# Patient Record
Sex: Female | Born: 1995 | Race: Black or African American | Hispanic: No | Marital: Single | Smoking: Never smoker
Health system: Southern US, Community
[De-identification: ages and names within clinical notes are randomized; demographics above are authoritative.]

## PROBLEM LIST (undated history)

## (undated) DIAGNOSIS — J349 Unspecified disorder of nose and nasal sinuses: Secondary | ICD-10-CM

## (undated) DIAGNOSIS — J40 Bronchitis, not specified as acute or chronic: Secondary | ICD-10-CM

## (undated) DIAGNOSIS — N200 Calculus of kidney: Secondary | ICD-10-CM

## (undated) DIAGNOSIS — J45909 Unspecified asthma, uncomplicated: Secondary | ICD-10-CM

---
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
## 2017-08-08 ENCOUNTER — Encounter (HOSPITAL_COMMUNITY): Payer: Self-pay | Admitting: Family Medicine

## 2017-08-08 ENCOUNTER — Emergency Department (HOSPITAL_COMMUNITY)
Admission: EM | Admit: 2017-08-08 | Discharge: 2017-08-08 | Disposition: A | Discharge status: Home / Self Care | Payer: Self-pay | Attending: Emergency Medicine | Admitting: Emergency Medicine

## 2017-08-08 DIAGNOSIS — J36 Peritonsillar abscess: Secondary | ICD-10-CM | POA: Insufficient documentation

## 2017-08-08 DIAGNOSIS — J45909 Unspecified asthma, uncomplicated: Secondary | ICD-10-CM | POA: Insufficient documentation

## 2017-08-08 LAB — RAPID STREP SCREEN (MED CTR MEBANE ONLY): STREPTOCOCCUS, GROUP A SCREEN (DIRECT): POSITIVE — AB

## 2017-08-08 MED ORDER — PENICILLIN G BENZATHINE & PROC 1200000 UNIT/2ML IM SUSP
1.2000 10*6.[IU] | Freq: Once | INTRAMUSCULAR | Status: DC
  Filled 2017-08-08: qty 2

## 2017-08-08 MED ORDER — OXYCODONE-ACETAMINOPHEN 5-325 MG PO TABS: 1.0000 | ORAL_TABLET | Freq: Three times a day (TID) | ORAL | 0 refills | Status: DC | PRN

## 2017-08-08 MED ORDER — DEXAMETHASONE SODIUM PHOSPHATE 10 MG/ML IJ SOLN
10.0000 mg | Freq: Once | INTRAMUSCULAR | Status: AC
  Administered 2017-08-08: 10 mg via INTRAVENOUS
  Filled 2017-08-08: qty 1

## 2017-08-08 MED ORDER — CLINDAMYCIN PHOSPHATE 600 MG/50ML IV SOLN
600.0000 mg | Freq: Once | INTRAVENOUS | Status: AC
  Administered 2017-08-08: 600 mg via INTRAVENOUS
  Filled 2017-08-08: qty 50

## 2017-08-08 MED ORDER — SODIUM CHLORIDE 0.9 % IV BOLUS (SEPSIS)
1000.0000 mL | Freq: Once | INTRAVENOUS | Status: AC
  Administered 2017-08-08: 1000 mL via INTRAVENOUS

## 2017-08-08 MED ORDER — CLINDAMYCIN HCL 300 MG PO CAPS: 300.0000 mg | ORAL_CAPSULE | Freq: Three times a day (TID) | ORAL | 0 refills | Status: AC

## 2017-08-08 NOTE — ED Notes (Signed)
Patient c/o sorethroat for several days, states this am she noticed a black area to the left side of her throat. Scabbed looking area on her left tonsil

## 2017-08-08 NOTE — ED Provider Notes (Signed)
[facility] Provider Note   CSN: [id number] Arrival date & time: 08/08/17  0807     History   Chief Complaint Chief Complaint  Patient presents with  . Sore Throat    HPI Melanie Yang is a 21 y.o. female with a history of strep pharyngitis who presents to the emergency department with a chief complaint of sore throat that began yesterday and significantly worsened overnight. She denies fever, chills, drooling, trismus, or muffled voice. She states she treated her symptoms by gargling salt water at home without relief.  The history is provided by the patient. No language interpreter was used.    Past Medical History:  Diagnosis Date  . Asthma   . Bronchitis   . Sinus trouble     There are no active problems to display for this patient.   History reviewed. No pertinent surgical history.  OB History    No data available       Home Medications    Prior to Admission medications   Medication Sig Start Date End Date Taking? Authorizing Provider  clindamycin (CLEOCIN) 300 MG capsule Take 1 capsule (300 mg total) by mouth 3 (three) times daily. 08/08/17 08/18/17  Xiamara Hulet A, PA-C  metroNIDAZOLE (FLAGYL) 500 MG tablet Take 1 tablet (500 mg total) by mouth 2 (two) times daily. One po bid x 7 days 10/23/12   Riki Sheer, PA-C  oxyCODONE-acetaminophen (PERCOCET/ROXICET) 5-325 MG tablet Take 1 tablet by mouth every 8 (eight) hours as needed for severe pain. 08/08/17   Aneeka Bowden, Coral Else, PA-C    Family History History reviewed. No pertinent family history.  Social History Social History  Substance Use Topics  . Smoking status: Never Smoker  . Smokeless tobacco: Not on file  . Alcohol use No     Allergies   Patient has no known allergies.   Review of Systems Review of Systems  Constitutional: Negative for activity change, chills and fever.  HENT: Positive for sore throat.   Respiratory: Negative for shortness of breath.   Cardiovascular: Negative  for chest pain.  Gastrointestinal: Negative for abdominal pain.  Musculoskeletal: Negative for back pain.  Skin: Negative for rash.   Physical Exam Updated Vital Signs BP 111/78 (BP Location: Left Arm)   Pulse 94   Temp 98.8 F (37.1 C) (Oral)   Resp 12   LMP 07/14/2017   SpO2 100%   Physical Exam  Constitutional: No distress.  HENT:  Head: Normocephalic.  Right Ear: Tympanic membrane normal.  Left Ear: Tympanic membrane normal.  Nose: Nose normal. Right sinus exhibits no maxillary sinus tenderness and no frontal sinus tenderness. Left sinus exhibits no maxillary sinus tenderness and no frontal sinus tenderness.  Mouth/Throat: No trismus in the jaw. Posterior oropharyngeal erythema present. Tonsils are 3+ on the left. Tonsillar exudate.  Uvula deviated.   Eyes: Conjunctivae are normal.  Neck: Neck supple.  Cardiovascular: Normal rate and regular rhythm.  Exam reveals no gallop and no friction rub.   No murmur heard. Pulmonary/Chest: Effort normal. No respiratory distress.  Abdominal: Soft. She exhibits no distension.  Neurological: She is alert.  Skin: Skin is warm. No rash noted.  Psychiatric: Her behavior is normal.  Nursing note and vitals reviewed.        ED Treatments / Results  Labs (all labs ordered are listed, but only abnormal results are displayed) Labs Reviewed  RAPID STREP SCREEN (NOT AT [facility]) - Abnormal; Notable for the following:  Result Value   Streptococcus, Group A Screen (Direct) POSITIVE (*)    All other components within normal limits    EKG  EKG Interpretation None       Radiology No results found.  Procedures Procedures (including critical care time)  Medications Ordered in ED Medications  sodium chloride 0.9 % bolus 1,000 mL (0 mLs Intravenous Stopped 08/08/17 1249)  dexamethasone (DECADRON) injection 10 mg (10 mg Intravenous Given 08/08/17 1127)  clindamycin (CLEOCIN) IVPB 600 mg (0 mg Intravenous Stopped 08/08/17 1248)      Initial Impression / Assessment and Plan / ED Course  I have reviewed the triage vital signs and the nursing notes.  Pertinent labs & imaging results that were available during my care of the patient were reviewed by me and considered in my medical decision making (see chart for details).     21 year old female presenting with a peritonsillar abscess. Rapid strep positive. Consulted ear nose and throat and spoke with Dr. Annalee Genta who recommended Decadron, IV fluids, and 1 dose of IV clindamycin in the emergency department followed by 10 days of clindamycin TID and pain medication. He instructed to have the patient follow up in his office if symptoms do not improve in 3-4 days. Strict return precautions given. No acute distress. The patient is safe for discharge at this time.  Final Clinical Impressions(s) / ED Diagnoses   Final diagnoses:  Peritonsillar abscess    New Prescriptions Discharge Medication List as of 08/08/2017 11:26 AM    START taking these medications   Details  clindamycin (CLEOCIN) 300 MG capsule Take 1 capsule (300 mg total) by mouth 3 (three) times daily., Starting Fri 08/08/2017, Until Mon 08/18/2017, Print         Marcy Sookdeo A, PA-C 08/08/17 Ed Blalock, MD 08/09/17 1409

## 2017-08-08 NOTE — Discharge Instructions (Signed)
You have been seen and treated today for a period tonsillar abscess. You were given 1 dose of clindamycin, an antibiotic, IV fluids, and a dose of Decadron, a steroid medication to help with swelling, in the emergency department.  Please take clindamycin every 8 hours for the next 10 days to treat your infection. This is an antibiotic so it is important that you do not miss any doses and he continue taking this medication even if you starts to feel better. Peritonsillar abscesses can be painful. You may take 1 tablet of Percocet every 8 hours as needed for pain control. Please do not drive or take this medication before you go to work because it can make you drowsy. It is also a narcotic medication and can be addicting so please only take for severe pain.  The area that appears purple and black on your tonsil can take up to a week to improve. This can be normal since your infection is caused by a bacteria called strep.  You can also take 800 mg of ibuprofen with food every 8 hours or 650 mg of Tylenol every 6 hours for pain and inflammation control.  If you have any problems filling your prescription for your antibiotic, please give [place] a call at the hospital.   If your symptoms do not improve by Monday or Tuesday, please call Dr. Thurmon Fair office to schedule a follow up appointment.   If you develop new or worsening symptoms including, difficulty breathing, shortness of breath, or feeling as if your throat is closing, please return to the emergency department for reevaluation.

## 2017-08-08 NOTE — ED Triage Notes (Signed)
Pt here for sore throat x 2 days with swelling and erythema.

## 2017-08-08 NOTE — Discharge Planning (Signed)
Pt  stating Rx prescribed may be too expensive and she can not afford them.  NCM searched [employer]Rx[employer]com for an affordable coupon.  NCM text coupon to pt phone and stayed online until it was received.  Pt very appreciative.

## 2021-10-01 ENCOUNTER — Other Ambulatory Visit: Payer: Self-pay

## 2021-10-01 ENCOUNTER — Encounter (HOSPITAL_COMMUNITY): Payer: Self-pay | Admitting: Emergency Medicine

## 2021-10-01 ENCOUNTER — Ambulatory Visit (HOSPITAL_COMMUNITY)
Admission: EM | Admit: 2021-10-01 | Discharge: 2021-10-01 | Disposition: A | Discharge status: Home / Self Care | Payer: Self-pay | Attending: Emergency Medicine | Admitting: Emergency Medicine

## 2021-10-01 DIAGNOSIS — B349 Viral infection, unspecified: Secondary | ICD-10-CM

## 2021-10-01 MED ORDER — LIDOCAINE VISCOUS HCL 2 % MT SOLN: 15.0000 mL | OROMUCOSAL | 0 refills | Status: DC | PRN

## 2021-10-01 NOTE — ED Triage Notes (Signed)
Pt c/o sore throat, body aches, cough, runny nose and fever x 2 days.

## 2021-10-01 NOTE — ED Provider Notes (Signed)
[facility]    CSN: [id number] Arrival date & time: 10/01/21  0805      History   Chief Complaint Chief Complaint  Patient presents with   Cough   Sore Throat   Generalized Body Aches    HPI Melanie Yang is a 25 y.o. female.   Patient presents with fever, chills, body aches, sore throat, nasal congestion, rhinorrhea, bilateral ear fullness and nonproductive cough for 2 days.  Tolerating food and liquids.  Not attempted treatment of symptoms.  Child at home has similar symptoms.  History of asthma. denies tenderness headaches, abdominal pain, nausea, vomiting, diarrhea, chest pain or tightness, shortness of breath, wheezing.  Past Medical History:  Diagnosis Date   Asthma    Bronchitis    Sinus trouble     There are no problems to display for this patient.   History reviewed. No pertinent surgical history.  OB History   No obstetric history on file.      Home Medications    Prior to Admission medications   Medication Sig Start Date End Date Taking? Authorizing Provider  metroNIDAZOLE (FLAGYL) 500 MG tablet Take 1 tablet (500 mg total) by mouth 2 (two) times daily. One po bid x 7 days 10/23/12   Riki Sheer, PA-C  oxyCODONE-acetaminophen (PERCOCET/ROXICET) 5-325 MG tablet Take 1 tablet by mouth every 8 (eight) hours as needed for severe pain. 08/08/17   McDonald, Coral Else, PA-C    Family History History reviewed. No pertinent family history.  Social History Social History   Tobacco Use   Smoking status: Never  Substance Use Topics   Alcohol use: No   Drug use: No     Allergies   Patient has no known allergies.   Review of Systems Review of Systems  Constitutional:  Positive for fever. Negative for activity change, appetite change, chills, diaphoresis, fatigue and unexpected weight change.  HENT:  Positive for congestion, ear pain, rhinorrhea and sore throat. Negative for dental problem, drooling, ear discharge, facial swelling, hearing  loss, mouth sores, nosebleeds, postnasal drip, sinus pressure, sinus pain, sneezing, tinnitus, trouble swallowing and voice change.   Respiratory:  Positive for cough and shortness of breath. Negative for apnea, choking, chest tightness, wheezing and stridor.   Cardiovascular: Negative.   Gastrointestinal:  Positive for nausea. Negative for abdominal distention, abdominal pain, anal bleeding, blood in stool, constipation, diarrhea, rectal pain and vomiting.  Skin: Negative.   Neurological: Negative.     Physical Exam Triage Vital Signs ED Triage Vitals  Enc Vitals Group     BP 10/01/21 0831 114/70     Pulse Rate 10/01/21 0831 90     Resp 10/01/21 0831 20     Temp 10/01/21 0831 98.7 F (37.1 C)     Temp Source 10/01/21 0831 Oral     SpO2 10/01/21 0831 100 %     Weight 10/01/21 0832 160 lb (72.6 kg)     Height 10/01/21 0832 4\' 11"  (1.499 m)     Head Circumference --      Peak Flow --      Pain Score 10/01/21 0832 6     Pain Loc --      Pain Edu? --      Excl. in GC? --    No data found.  Updated Vital Signs BP 114/70   Pulse 90   Temp 98.7 F (37.1 C) (Oral)   Resp 20   Ht 4\' 11"  (1.499 m)   Wt  160 lb (72.6 kg)   SpO2 99%   BMI 32.32 kg/m   Visual Acuity Right Eye Distance:   Left Eye Distance:   Bilateral Distance:    Right Eye Near:   Left Eye Near:    Bilateral Near:     Physical Exam Constitutional:      Appearance: Normal appearance. She is normal weight.  HENT:     Head: Normocephalic.     Right Ear: Tympanic membrane, ear canal and external ear normal.     Left Ear: Tympanic membrane and external ear normal.     Nose: Congestion and rhinorrhea present.     Mouth/Throat:     Mouth: Mucous membranes are moist.     Pharynx: Posterior oropharyngeal erythema present.     Tonsils: No tonsillar exudate or tonsillar abscesses. 1+ on the right. 1+ on the left.  Eyes:     Extraocular Movements: Extraocular movements intact.  Cardiovascular:     Rate and  Rhythm: Normal rate and regular rhythm.     Pulses: Normal pulses.     Heart sounds: Normal heart sounds.  Pulmonary:     Effort: Pulmonary effort is normal.     Breath sounds: Normal breath sounds.  Musculoskeletal:     Cervical back: Normal range of motion.  Lymphadenopathy:     Cervical: Cervical adenopathy present.  Skin:    General: Skin is warm and dry.  Neurological:     Mental Status: She is alert and oriented to person, place, and time. Mental status is at baseline.  Psychiatric:        Mood and Affect: Mood normal.        Behavior: Behavior normal.     UC Treatments / Results  Labs (all labs ordered are listed, but only abnormal results are displayed) Labs Reviewed - No data to display  EKG   Radiology No results found.  Procedures Procedures (including critical care time)  Medications Ordered in UC Medications - No data to display  Initial Impression / Assessment and Plan / UC Course  I have reviewed the triage vital signs and the nursing notes.  Pertinent labs & imaging results that were available during my care of the patient were reviewed by me and considered in my medical decision making (see chart for details).  Viral illness  1.  Lidocaine viscous 2% 15 mils every 4 hours as needed 2.  Over-the-counter medications for remaining symptom management 3.  Urgent care follow-up as needed Final Clinical Impressions(s) / UC Diagnoses   Final diagnoses:  None   Discharge Instructions   None    ED Prescriptions   None    PDMP not reviewed this encounter.   Valinda Hoar, [place] 10/01/21 [phone]

## 2021-10-01 NOTE — Discharge Instructions (Signed)
Your symptoms are most likely viral will resolve on their own, it may take up to 7 to 10 days before you truly start to feel better  You may gargle and spit Lidocaine solution every 4 hours as needed to provide temporary relief for your Throat  You can take Tylenol and/or Ibuprofen as needed for fever reduction and pain relief.   For cough: honey 1/2 to 1 teaspoon (you can dilute the honey in water or another fluid).  You can also use guaifenesin and dextromethorphan for cough. You can use a humidifier for chest congestion and cough.  If you don't have a humidifier, you can sit in the bathroom with the hot shower running.      For sore throat: try warm salt water gargles, cepacol lozenges, throat spray, warm tea or water with lemon/honey, popsicles or ice, or OTC cold relief medicine for throat discomfort.   For congestion: take a daily anti-histamine like Zyrtec, Claritin, and a oral decongestant, such as pseudoephedrine.  You can also use Flonase 1-2 sprays in each nostril daily.   It is important to stay hydrated: drink plenty of fluids (water, gatorade/powerade/pedialyte, juices, or teas) to keep your throat moisturized and help further relieve irritation/discomfort.

## 2021-10-30 ENCOUNTER — Ambulatory Visit (HOSPITAL_COMMUNITY)
Admission: EM | Admit: 2021-10-30 | Discharge: 2021-10-30 | Disposition: A | Discharge status: Home / Self Care | Payer: Self-pay | Attending: Urgent Care | Admitting: Urgent Care

## 2021-10-30 ENCOUNTER — Other Ambulatory Visit: Payer: Self-pay

## 2021-10-30 ENCOUNTER — Encounter (HOSPITAL_COMMUNITY): Payer: Self-pay | Admitting: Emergency Medicine

## 2021-10-30 DIAGNOSIS — R109 Unspecified abdominal pain: Secondary | ICD-10-CM | POA: Insufficient documentation

## 2021-10-30 DIAGNOSIS — R110 Nausea: Secondary | ICD-10-CM | POA: Insufficient documentation

## 2021-10-30 LAB — POCT URINALYSIS DIPSTICK, ED / UC
Bilirubin Urine: NEGATIVE
Glucose, UA: NEGATIVE mg/dL
Ketones, ur: NEGATIVE mg/dL
Leukocytes,Ua: NEGATIVE
Nitrite: NEGATIVE
Protein, ur: NEGATIVE mg/dL
Specific Gravity, Urine: 1.02 (ref 1.005–1.030)
Urobilinogen, UA: 1 mg/dL (ref 0.0–1.0)
pH: 7.5 (ref 5.0–8.0)

## 2021-10-30 LAB — POC URINE PREG, ED: Preg Test, Ur: NEGATIVE

## 2021-10-30 MED ORDER — TIZANIDINE HCL 4 MG PO TABS: 4.0000 mg | ORAL_TABLET | Freq: Every day | ORAL | 0 refills | Status: DC

## 2021-10-30 MED ORDER — NAPROXEN 500 MG PO TABS: 500.0000 mg | ORAL_TABLET | Freq: Two times a day (BID) | ORAL | 0 refills | Status: DC

## 2021-10-30 NOTE — Discharge Instructions (Signed)
We will notify you of any positive results from your testing today. Our nurse will let you know if you need any kind of treatment based off of those results. In the meantime, use naproxen and tizanidine for musculoskeletal pain. If you worsen then please report to the ER as you will need imaging and blood work to make sure everything is ok internally.

## 2021-10-30 NOTE — ED Provider Notes (Signed)
[place]   MRN: [id number] DOB: 1996/03/02  Subjective:   Khrystine Druschel is a 25 y.o. female presenting for 2-day history of acute onset persistent left-sided abdominal pain, flank pain, intermittent nausea without vomiting, urinary frequency.  No dysuria, hematuria, vaginal discharge, pelvic pain, chest pain, shortness of breath.  Patient is agreeable to a pregnancy test, LMP was a week ago.  No concern for STI but is not opposed to this testing either.  No constipation, history of GI issues.  No history of kidney stones.  Patient does not do any strenuous lifting, no falls, trauma.  No current facility-administered medications for this encounter.  Current Outpatient Medications:    lidocaine (XYLOCAINE) 2 % solution, Use as directed 15 mLs in the mouth or throat as needed for mouth pain., Disp: 100 mL, Rfl: 0   metroNIDAZOLE (FLAGYL) 500 MG tablet, Take 1 tablet (500 mg total) by mouth 2 (two) times daily. One po bid x 7 days, Disp: 14 tablet, Rfl: 0   oxyCODONE-acetaminophen (PERCOCET/ROXICET) 5-325 MG tablet, Take 1 tablet by mouth every 8 (eight) hours as needed for severe pain., Disp: 8 tablet, Rfl: 0   No Known Allergies  Past Medical History:  Diagnosis Date   Asthma    Bronchitis    Sinus trouble      History reviewed. No pertinent surgical history.  No family history on file.  Social History   Tobacco Use   Smoking status: Never  Substance Use Topics   Alcohol use: No   Drug use: No    ROS   Objective:   Vitals: BP 129/87 (BP Location: Left Arm)   Pulse 96   Temp 98.4 F (36.9 C) (Oral)   Resp 18   LMP 10/20/2021   SpO2 98%   Physical Exam Constitutional:      General: She is not in acute distress.    Appearance: Normal appearance. She is well-developed. She is not ill-appearing, toxic-appearing or diaphoretic.  HENT:     Head: Normocephalic and atraumatic.     Nose: Nose normal.     Mouth/Throat:     Mouth: Mucous  membranes are moist.     Pharynx: Oropharynx is clear.  Eyes:     General: No scleral icterus.       Right eye: No discharge.        Left eye: No discharge.     Extraocular Movements: Extraocular movements intact.     Conjunctiva/sclera: Conjunctivae normal.     Pupils: Pupils are equal, round, and reactive to light.  Cardiovascular:     Rate and Rhythm: Normal rate and regular rhythm.     Pulses: Normal pulses.     Heart sounds: Normal heart sounds. No murmur heard.   No friction rub. No gallop.  Pulmonary:     Effort: Pulmonary effort is normal. No respiratory distress.     Breath sounds: Normal breath sounds. No stridor. No wheezing, rhonchi or rales.  Abdominal:     General: Bowel sounds are normal. There is no distension.     Palpations: Abdomen is soft. There is no mass.     Tenderness: There is abdominal tenderness (mild over left flank side). There is no right CVA tenderness, left CVA tenderness, guarding or rebound.  Skin:    General: Skin is warm and dry.     Findings: No rash.  Neurological:     General: No focal deficit present.     Mental Status: She  is alert and oriented to person, place, and time.  Psychiatric:        Mood and Affect: Mood normal.        Behavior: Behavior normal.        Thought Content: Thought content normal.        Judgment: Judgment normal.    Results for orders placed or performed during the hospital encounter of 10/30/21 (from the past 24 hour(s))  POC Urinalysis dipstick     Status: Abnormal   Collection Time: 10/30/21 10:41 AM  Result Value Ref Range   Glucose, UA NEGATIVE NEGATIVE mg/dL   Bilirubin Urine NEGATIVE NEGATIVE   Ketones, ur NEGATIVE NEGATIVE mg/dL   Specific Gravity, Urine 1.020 1.005 - 1.030   Hgb urine dipstick TRACE (A) NEGATIVE   pH 7.5 5.0 - 8.0   Protein, ur NEGATIVE NEGATIVE mg/dL   Urobilinogen, UA 1.0 0.0 - 1.0 mg/dL   Nitrite NEGATIVE NEGATIVE   Leukocytes,Ua NEGATIVE NEGATIVE  POC urine pregnancy      Status: None   Collection Time: 10/30/21 10:45 AM  Result Value Ref Range   Preg Test, Ur NEGATIVE NEGATIVE     Assessment and Plan :   PDMP not reviewed this encounter.  1. Left sided abdominal pain   2. Nausea    Will manage conservatively with naproxen, tizanidine for musculoskeletal pain.  Patient does not endorse any constipation, urinalysis is equivocal.  STI testing pending.  Low suspicion for an acute abdomen, diverticulitis or any other acute intra-abdominal process.  Do not suspect renal colic, pyelonephritis. Counseled patient on potential for adverse effects with medications prescribed/recommended today, ER and return-to-clinic precautions discussed, patient verbalized understanding.    Wallis Bamberg, PA-C 10/30/21 1520

## 2021-10-30 NOTE — ED Triage Notes (Signed)
Pt reports having abd pain more on left side that radiates to her back for a couple days with nausea.

## 2021-10-31 ENCOUNTER — Emergency Department (HOSPITAL_COMMUNITY): Payer: Self-pay

## 2021-10-31 ENCOUNTER — Emergency Department (HOSPITAL_COMMUNITY)
Admission: EM | Admit: 2021-10-31 | Discharge: 2021-10-31 | Disposition: A | Discharge status: Home / Self Care | Payer: Self-pay | Attending: Emergency Medicine | Admitting: Emergency Medicine

## 2021-10-31 ENCOUNTER — Encounter (HOSPITAL_COMMUNITY): Payer: Self-pay

## 2021-10-31 ENCOUNTER — Telehealth (HOSPITAL_COMMUNITY): Payer: Self-pay | Admitting: Nurse Practitioner

## 2021-10-31 DIAGNOSIS — J45909 Unspecified asthma, uncomplicated: Secondary | ICD-10-CM | POA: Insufficient documentation

## 2021-10-31 DIAGNOSIS — R109 Unspecified abdominal pain: Secondary | ICD-10-CM | POA: Insufficient documentation

## 2021-10-31 LAB — URINALYSIS, MICROSCOPIC (REFLEX)
Bacteria, UA: NONE SEEN
Squamous Epithelial / HPF: NONE SEEN (ref 0–5)

## 2021-10-31 LAB — CBC WITH DIFFERENTIAL/PLATELET
Abs Immature Granulocytes: 0.05 10*3/uL (ref 0.00–0.07)
Basophils Absolute: 0 10*3/uL (ref 0.0–0.1)
Basophils Relative: 0 %
Eosinophils Absolute: 0 10*3/uL (ref 0.0–0.5)
Eosinophils Relative: 0 %
HCT: 38.3 % (ref 36.0–46.0)
Hemoglobin: 12.1 g/dL (ref 12.0–15.0)
Immature Granulocytes: 1 %
Lymphocytes Relative: 28 %
Lymphs Abs: 2.6 10*3/uL (ref 0.7–4.0)
MCH: 28.4 pg (ref 26.0–34.0)
MCHC: 31.6 g/dL (ref 30.0–36.0)
MCV: 89.9 fL (ref 80.0–100.0)
Monocytes Absolute: 0.6 10*3/uL (ref 0.1–1.0)
Monocytes Relative: 6 %
Neutro Abs: 5.9 10*3/uL (ref 1.7–7.7)
Neutrophils Relative %: 65 %
Platelets: 255 10*3/uL (ref 150–400)
RBC: 4.26 MIL/uL (ref 3.87–5.11)
RDW: 12.9 % (ref 11.5–15.5)
WBC: 9.1 10*3/uL (ref 4.0–10.5)
nRBC: 0 % (ref 0.0–0.2)

## 2021-10-31 LAB — CERVICOVAGINAL ANCILLARY ONLY
Bacterial Vaginitis (gardnerella): POSITIVE — AB
Chlamydia: NEGATIVE
Comment: NEGATIVE
Comment: NEGATIVE
Comment: NEGATIVE
Comment: NORMAL
Neisseria Gonorrhea: NEGATIVE
Trichomonas: POSITIVE — AB

## 2021-10-31 LAB — HEPATIC FUNCTION PANEL
ALT: 12 U/L (ref 0–44)
AST: 18 U/L (ref 15–41)
Albumin: 3.7 g/dL (ref 3.5–5.0)
Alkaline Phosphatase: 69 U/L (ref 38–126)
Bilirubin, Direct: <0.1 mg/dL (ref 0.0–0.2)
Total Bilirubin: 0.6 mg/dL (ref 0.3–1.2)
Total Protein: 7.2 g/dL (ref 6.5–8.1)

## 2021-10-31 LAB — URINALYSIS, ROUTINE W REFLEX MICROSCOPIC
Bilirubin Urine: NEGATIVE
Glucose, UA: NEGATIVE mg/dL
Ketones, ur: NEGATIVE mg/dL
Nitrite: NEGATIVE
Protein, ur: NEGATIVE mg/dL
Specific Gravity, Urine: <1.005 — ABNORMAL LOW (ref 1.005–1.030)
pH: 6 (ref 5.0–8.0)

## 2021-10-31 LAB — MAGNESIUM: Magnesium: 1.9 mg/dL (ref 1.7–2.4)

## 2021-10-31 LAB — BASIC METABOLIC PANEL
Anion gap: 7 (ref 5–15)
BUN: <5 mg/dL — ABNORMAL LOW (ref 6–20)
CO2: 24 mmol/L (ref 22–32)
Calcium: 9.4 mg/dL (ref 8.9–10.3)
Chloride: 106 mmol/L (ref 98–111)
Creatinine, Ser: 0.78 mg/dL (ref 0.44–1.00)
GFR, Estimated: >60 mL/min (ref >60)
Glucose, Bld: 114 mg/dL — ABNORMAL HIGH (ref 70–99)
Potassium: 3.3 mmol/L — ABNORMAL LOW (ref 3.5–5.1)
Sodium: 137 mmol/L (ref 135–145)

## 2021-10-31 LAB — PREGNANCY, URINE: Preg Test, Ur: NEGATIVE

## 2021-10-31 LAB — POC URINE PREG, ED: Preg Test, Ur: NEGATIVE

## 2021-10-31 MED ORDER — LACTATED RINGERS IV BOLUS
1000.0000 mL | Freq: Once | INTRAVENOUS | Status: AC
  Administered 2021-10-31: 1000 mL via INTRAVENOUS

## 2021-10-31 MED ORDER — METRONIDAZOLE 500 MG PO TABS: 500.0000 mg | ORAL_TABLET | Freq: Two times a day (BID) | ORAL | 0 refills | Status: DC

## 2021-10-31 MED ORDER — ONDANSETRON HCL 4 MG/2ML IJ SOLN
4.0000 mg | Freq: Once | INTRAMUSCULAR | Status: AC
  Administered 2021-10-31: 4 mg via INTRAVENOUS
  Filled 2021-10-31: qty 2

## 2021-10-31 MED ORDER — KETOROLAC TROMETHAMINE 15 MG/ML IJ SOLN
15.0000 mg | Freq: Once | INTRAMUSCULAR | Status: AC
  Administered 2021-10-31: 15 mg via INTRAVENOUS
  Filled 2021-10-31: qty 1

## 2021-10-31 MED ORDER — METHOCARBAMOL 500 MG PO TABS
500.0000 mg | ORAL_TABLET | Freq: Once | ORAL | Status: AC
  Administered 2021-10-31: 500 mg via ORAL
  Filled 2021-10-31: qty 1

## 2021-10-31 MED ORDER — ACETAMINOPHEN 325 MG PO TABS
650.0000 mg | ORAL_TABLET | Freq: Once | ORAL | Status: AC
  Administered 2021-10-31: 650 mg via ORAL
  Filled 2021-10-31: qty 2

## 2021-10-31 MED ORDER — METHOCARBAMOL 500 MG PO TABS: 500.0000 mg | ORAL_TABLET | Freq: Two times a day (BID) | ORAL | 0 refills | Status: DC | PRN

## 2021-10-31 NOTE — ED Provider Notes (Signed)
[facility] EMERGENCY DEPARTMENT Provider Note   CSN: [id number] Arrival date & time: 10/31/21  0049     History Chief Complaint  Patient presents with   Flank Pain    Melanie Yang is a 25 y.o. female.   Flank Pain Associated symptoms include abdominal pain. Pertinent negatives include no chest pain, no headaches and no shortness of breath. Patient presents for left-sided flank pain over the past 3 days.  Initially pain was in her left side of abdomen.  It is since radiated to her left flank and left-sided back.  She has been trying to drink lots of water at home.  She went to urgent care yesterday.  At urgent care, they checked her urine and swab her for STIs.  While there, she was told that she had blood in her urine.  They also told her to follow-up about results of vaginal swabs.  She presents to the ED due to concern of kidney stones.  Patient has also noticed some bruising on her fingers and feels that her eyes have had some yellowing.  Currently, she endorses 6/10 severity pain in her left flank.  She does endorse mild nausea.  She denies any recent vomiting, diarrhea, fevers, or chills.  LMP was last week.     Past Medical History:  Diagnosis Date   Asthma    Bronchitis    Sinus trouble     There are no problems to display for this patient.   History reviewed. No pertinent surgical history.   OB History   No obstetric history on file.     History reviewed. No pertinent family history.  Social History   Tobacco Use   Smoking status: Never  Substance Use Topics   Alcohol use: No   Drug use: No    Home Medications Prior to Admission medications   Medication Sig Start Date End Date Taking? Authorizing Provider  methocarbamol (ROBAXIN) 500 MG tablet Take 1 tablet (500 mg total) by mouth every 12 (twelve) hours as needed for up to 12 doses for muscle spasms. 10/31/21  Yes Godfrey Pick, MD  lidocaine (XYLOCAINE) 2 % solution Use as directed  15 mLs in the mouth or throat as needed for mouth pain. 10/01/21   White, Leitha Schuller, NP  metroNIDAZOLE (FLAGYL) 500 MG tablet Take 1 tablet (500 mg total) by mouth 2 (two) times daily. Finish all the medication. Do not drink alcohol while on medication. 10/31/21   Enrique Sack, FNP  naproxen (NAPROSYN) 500 MG tablet Take 1 tablet (500 mg total) by mouth 2 (two) times daily with a meal. 10/30/21   Jaynee Eagles, PA-C  oxyCODONE-acetaminophen (PERCOCET/ROXICET) 5-325 MG tablet Take 1 tablet by mouth every 8 (eight) hours as needed for severe pain. 08/08/17   McDonald, Mia A, PA-C  tiZANidine (ZANAFLEX) 4 MG tablet Take 1 tablet (4 mg total) by mouth at bedtime. 10/30/21   Jaynee Eagles, PA-C    Allergies    Patient has no known allergies.  Review of Systems   Review of Systems  Constitutional:  Negative for activity change, appetite change, chills, fatigue and fever.  HENT:  Negative for congestion, ear pain and sore throat.   Eyes:  Negative for pain and visual disturbance.  Respiratory:  Negative for cough, chest tightness and shortness of breath.   Cardiovascular:  Negative for chest pain and palpitations.  Gastrointestinal:  Positive for abdominal pain and nausea. Negative for diarrhea and vomiting.  Genitourinary:  Positive for flank  pain. Negative for difficulty urinating, dysuria, hematuria and pelvic pain.  Musculoskeletal:  Negative for arthralgias, back pain, gait problem, joint swelling, myalgias and neck pain.  Skin:  Negative for color change and rash.  Neurological:  Negative for dizziness, seizures, syncope, weakness, light-headedness, numbness and headaches.  Hematological:  Does not bruise/bleed easily.  Psychiatric/Behavioral:  Negative for confusion and decreased concentration.   All other systems reviewed and are negative.  Physical Exam Updated Vital Signs BP 115/70 (BP Location: Left Arm)   Pulse 62   Temp 98.5 F (36.9 C) (Oral)   Resp 18   Ht 4\' 11"  (1.499 m)   Wt  73 kg   LMP 10/20/2021   SpO2 100%   BMI 32.51 kg/m   Physical Exam Vitals and nursing note reviewed.  Constitutional:      General: She is not in acute distress.    Appearance: Normal appearance. She is well-developed and normal weight. She is not ill-appearing, toxic-appearing or diaphoretic.  HENT:     Head: Normocephalic and atraumatic.     Right Ear: External ear normal.     Left Ear: External ear normal.     Nose: Nose normal. No congestion or rhinorrhea.     Mouth/Throat:     Mouth: Mucous membranes are moist.     Pharynx: Oropharynx is clear.  Eyes:     Extraocular Movements: Extraocular movements intact.     Conjunctiva/sclera: Conjunctivae normal.  Cardiovascular:     Rate and Rhythm: Normal rate and regular rhythm.     Heart sounds: No murmur heard. Pulmonary:     Effort: Pulmonary effort is normal. No respiratory distress.     Breath sounds: Normal breath sounds. No wheezing or rales.  Abdominal:     General: Abdomen is flat.     Palpations: Abdomen is soft.     Tenderness: There is no abdominal tenderness.  Musculoskeletal:        General: No swelling.     Cervical back: Normal range of motion and neck supple. No rigidity.     Right lower leg: No edema.     Left lower leg: No edema.  Skin:    General: Skin is warm and dry.     Capillary Refill: Capillary refill takes less than 2 seconds.     Coloration: Skin is not jaundiced or pale.  Neurological:     General: No focal deficit present.     Mental Status: She is alert and oriented to person, place, and time.     Cranial Nerves: No cranial nerve deficit.     Sensory: No sensory deficit.     Motor: No weakness.     Coordination: Coordination normal.  Psychiatric:        Attention and Perception: Attention and perception normal.        Mood and Affect: Mood is anxious.        Speech: Speech normal.        Behavior: Behavior normal. Behavior is cooperative.        Thought Content: Thought content normal.     ED Results / Procedures / Treatments   Labs (all labs ordered are listed, but only abnormal results are displayed) Labs Reviewed  BASIC METABOLIC PANEL - Abnormal; Notable for the following components:      Result Value   Potassium 3.3 (*)    Glucose, Bld 114 (*)    BUN <5 (*)    All other components within normal limits  URINALYSIS, ROUTINE W REFLEX MICROSCOPIC - Abnormal; Notable for the following components:   Color, Urine STRAW (*)    Specific Gravity, Urine <1.005 (*)    Hgb urine dipstick SMALL (*)    Leukocytes,Ua SMALL (*)    All other components within normal limits  CBC WITH DIFFERENTIAL/PLATELET  URINALYSIS, MICROSCOPIC (REFLEX)  HEPATIC FUNCTION PANEL  MAGNESIUM  PREGNANCY, URINE  I-STAT BETA HCG BLOOD, ED ([facility], [facility], [facility] ONLY)  POC URINE PREG, ED    EKG None  Radiology CT Renal Stone Study  Result Date: 10/31/2021 CLINICAL DATA:  Flank pain with kidney stone suspected EXAM: CT ABDOMEN AND PELVIS WITHOUT CONTRAST TECHNIQUE: Multidetector CT imaging of the abdomen and pelvis was performed following the standard protocol without IV contrast. COMPARISON:  None. FINDINGS: Lower chest:  No contributory findings. Hepatobiliary: No focal liver abnormality.No evidence of biliary obstruction or stone. Pancreas: Unremarkable. Spleen: Unremarkable. Adrenals/Urinary Tract: Negative adrenals. 2 right lower pole renal calculi, the larger measuring 6 mm. Unremarkable bladder. Stomach/Bowel:  No obstruction. No appendicitis. Vascular/Lymphatic: No acute vascular abnormality. No mass or adenopathy. Reproductive:No pathologic findings. Other: No ascites or pneumoperitoneum. Musculoskeletal: No acute abnormalities. IMPRESSION: No acute finding.  No hydronephrosis or ureteral calculus. Two right lower pole renal calculi, the larger measuring 6 mm. Electronically Signed   By: Jorje Guild M.D.   On: 10/31/2021 04:36    Procedures Procedures   Medications Ordered in ED Medications   acetaminophen (TYLENOL) tablet 650 mg (650 mg Oral Given 10/31/21 0138)  lactated ringers bolus 1,000 mL (0 mLs Intravenous Stopped 10/31/21 1000)  ketorolac (TORADOL) 15 MG/ML injection 15 mg (15 mg Intravenous Given 10/31/21 0839)  ondansetron (ZOFRAN) injection 4 mg (4 mg Intravenous Given 10/31/21 0838)  methocarbamol (ROBAXIN) tablet 500 mg (500 mg Oral Given 10/31/21 1008)    ED Course  I have reviewed the triage vital signs and the nursing notes.  Pertinent labs & imaging results that were available during my care of the patient were reviewed by me and considered in my medical decision making (see chart for details).    MDM Rules/Calculators/A&P                          Patient presents for 3 days of left flank pain and microscopic hematuria diagnosed yesterday at urgent care.  Prior to being bedded in the ED, she was able to undergo laboratory work-up and CT stone study.  Stone study showed no obstructive stones.  She does have 2 stones in lower pole of right kidney.  Urinalysis shows trace microscopic hematuria.  There is no evidence of UTI.  She has no leukocytosis.  On exam, she is well-appearing.  She continues to endorse left flank pain and nausea.  IV fluids, Toradol, and Zofran ordered for symptomatic relief.  Given her concerns of scleral icterus, LFTs were ordered.  On reassessment, patient reports continued pain in the area of her left back.  Dose of Robaxin was ordered.  Results of additional lab work are all reassuring.  Patient underwent cervical vaginal swabbing yesterday.  Results of that test are still in process.  Patient was advised to follow-up on those results and to return to the ED if she does experience any worsening of symptoms.  Patient was discharged in good condition.  Final Clinical Impression(s) / ED Diagnoses Final diagnoses:  Flank pain    Rx / DC Orders ED Discharge Orders  Ordered    methocarbamol (ROBAXIN) 500 MG tablet  Every 12 hours PRN         10/31/21 1103             Gloris Manchester, MD 10/31/21 1645

## 2021-10-31 NOTE — Telephone Encounter (Signed)
Patient reviewed lab results on MyChart which was positive for trichomonas and bacterial vaginosis. Treatment is indicated. Medication sent to patient's pharmacy. She should finish all the medications. Drink plenty of fluids. No alcohol while on medication. Avoid sexual contact until treatment completed, symptoms resolved and partner(s) finishes treatment. Nursing reviewed plan of treatment as discussed above with patient.

## 2021-10-31 NOTE — ED Provider Notes (Signed)
Emergency Medicine Provider Triage Evaluation Note  Melanie Yang , a 25 y.o. female  was evaluated in triage.  Pt complains of left flank pain.  Seen at UC earlier today and told it was MSK but pain has worsened.  Denies dysuria or hematuria but feels urine has been decreased.  Has STD testing earlier today.  No hx kidney stones.  Did have blood in UA at UC.  Review of Systems  Positive: Left flank pain Negative: Vomiting, diarrhea  Physical Exam  BP 122/77 (BP Location: Right Arm)   Pulse 80   Temp 98.4 F (36.9 C) (Oral)   Resp 20   Ht 4\' 11"  (1.499 m)   Wt 73 kg   LMP 10/20/2021   SpO2 99%   BMI 32.51 kg/m   Gen:   Awake, no distress   Resp:  Normal effort  MSK:   Moves extremities without difficulty  Other:    Medical Decision Making  Medically screening exam initiated at 1:24 AM.  Appropriate orders placed.  Melanie Yang was informed that the remainder of the evaluation will be completed by another provider, this initial triage assessment does not replace that evaluation, and the importance of remaining in the ED until their evaluation is complete.  Left flank pain.  Seen at UC earlier today, felt to be MSK but pain worsening.  Did have hematuria on UA.  Will check labs, UA, CT renal stone study.   10/22/2021, PA-C 10/31/21 0136    14/07/22, MD 10/31/21 [phone]

## 2021-10-31 NOTE — ED Triage Notes (Signed)
Pt arrives POV for eval of L sided flank pain w/ radiation to back. Pt reports onset Sunday. Pt reports N/V on Sunday, but not since then. Pt was seen at [facility] today for pain, sent home w/ NSAIDS and muscle relaxers. Pt reports she had small blood in her urine today. Concerned for kidney stone

## 2021-10-31 NOTE — ED Notes (Signed)
Called pt no answer °

## 2021-11-04 ENCOUNTER — Emergency Department (HOSPITAL_COMMUNITY): Payer: Self-pay

## 2021-11-04 ENCOUNTER — Emergency Department (HOSPITAL_COMMUNITY)
Admission: EM | Admit: 2021-11-04 | Discharge: 2021-11-04 | Disposition: A | Discharge status: Home / Self Care | Payer: Self-pay | Attending: Emergency Medicine | Admitting: Emergency Medicine

## 2021-11-04 ENCOUNTER — Encounter (HOSPITAL_COMMUNITY): Payer: Self-pay | Admitting: Emergency Medicine

## 2021-11-04 ENCOUNTER — Other Ambulatory Visit: Payer: Self-pay

## 2021-11-04 DIAGNOSIS — M549 Dorsalgia, unspecified: Secondary | ICD-10-CM

## 2021-11-04 DIAGNOSIS — Z20822 Contact with and (suspected) exposure to covid-19: Secondary | ICD-10-CM | POA: Insufficient documentation

## 2021-11-04 DIAGNOSIS — J45909 Unspecified asthma, uncomplicated: Secondary | ICD-10-CM | POA: Insufficient documentation

## 2021-11-04 DIAGNOSIS — R112 Nausea with vomiting, unspecified: Secondary | ICD-10-CM

## 2021-11-04 HISTORY — DX: Calculus of kidney: N20.0

## 2021-11-04 LAB — URINALYSIS, ROUTINE W REFLEX MICROSCOPIC
Bacteria, UA: NONE SEEN
Bilirubin Urine: NEGATIVE
Glucose, UA: NEGATIVE mg/dL
Ketones, ur: NEGATIVE mg/dL
Leukocytes,Ua: NEGATIVE
Nitrite: NEGATIVE
Protein, ur: NEGATIVE mg/dL
Specific Gravity, Urine: 1.001 — ABNORMAL LOW (ref 1.005–1.030)
pH: 7 (ref 5.0–8.0)

## 2021-11-04 LAB — RESP PANEL BY RT-PCR (FLU A&B, COVID) ARPGX2
Influenza A by PCR: NEGATIVE
Influenza B by PCR: NEGATIVE
SARS Coronavirus 2 by RT PCR: NEGATIVE

## 2021-11-04 LAB — PREGNANCY, URINE: Preg Test, Ur: NEGATIVE

## 2021-11-04 MED ORDER — ONDANSETRON 4 MG PO TBDP: 4.0000 mg | ORAL_TABLET | Freq: Three times a day (TID) | ORAL | 0 refills | Status: DC | PRN

## 2021-11-04 NOTE — ED Provider Notes (Signed)
Emergency Medicine Provider Triage Evaluation Note  Makaia Rappa , a 25 y.o. female  was evaluated in triage.  Pt complains of back pain.  She states that she is having swelling on the right sided upper back.  She first noticed this today.  She has had increasing pain in the area.  She was recently seen and diagnosed with a kidney stone, trichomoniasis, and BV, she states she has been taking the Flagyl.  She does note that she slept on the couch overnight.  Her pain is made worse with movement.  No vomiting.  Review of Systems  Positive: Back pain Negative: Fever  Physical Exam  BP (!) 117/95   Pulse (!) 101   Temp 98.2 F (36.8 C)   Resp 16   LMP 10/20/2021   SpO2 97%  Gen:   Awake, no distress   Resp:  Normal effort  MSK:   Moves extremities without difficulty  Other:  No respiratory distress.  She has tenderness over the right sided mid to upper back and left-sided mid upper back.  There is no discrete localized CVA tenderness to percussion.  Medical Decision Making  Medically screening exam initiated at 6:09 PM.  Appropriate orders placed.  Reyana Clausen was informed that the remainder of the evaluation will be completed by another provider, this initial triage assessment does not replace that evaluation, and the importance of remaining in the ED until their evaluation is complete.  Will check UA given recent stone and CXR given pain in her back.    Cristina Gong, PA-C 11/04/21 1810    Pollyann Savoy, MD 11/04/21 1949

## 2021-11-04 NOTE — ED Triage Notes (Signed)
Pt states she was diagnosed with a kidney stone on 12/7.  Taking Naproxen.  Reports pain and swelling across upper back that started today.

## 2021-11-04 NOTE — Discharge Instructions (Signed)
Follow-up with your primary care doctor if symptoms not improved.  A COVID test has been done but is not resulted yet

## 2021-11-09 NOTE — ED Provider Notes (Signed)
[facility] EMERGENCY DEPARTMENT Provider Note   CSN: [id number] Arrival date & time: 11/04/21  1737     History Chief Complaint  Patient presents with   back swelling    Melanie Yang is a 25 y.o. female.  HPI Patient presents with right flank pain.  States there is some swelling in the area.  Slept on the couch.  Thinks that may have hurting.  Had CT scan done around 4 days prior that showed some renal stones without obstruction.  Started on metronidazole and has felt a little worse since.  Had some vaginal discharge and that is what that metronidazole is for.  No fevers.  Also states she is feeling a little bad all over.    Past Medical History:  Diagnosis Date   Asthma    Bronchitis    Kidney stone    Sinus trouble     There are no problems to display for this patient.   History reviewed. No pertinent surgical history.   OB History   No obstetric history on file.     No family history on file.  Social History   Tobacco Use   Smoking status: Never  Substance Use Topics   Alcohol use: No   Drug use: No    Home Medications Prior to Admission medications   Medication Sig Start Date End Date Taking? Authorizing Provider  ondansetron (ZOFRAN-ODT) 4 MG disintegrating tablet Take 1 tablet (4 mg total) by mouth every 8 (eight) hours as needed for nausea or vomiting. 11/04/21  Yes Benjiman Core, MD  lidocaine (XYLOCAINE) 2 % solution Use as directed 15 mLs in the mouth or throat as needed for mouth pain. 10/01/21   White, Elita Boone, NP  methocarbamol (ROBAXIN) 500 MG tablet Take 1 tablet (500 mg total) by mouth every 12 (twelve) hours as needed for up to 12 doses for muscle spasms. 10/31/21   Gloris Manchester, MD  metroNIDAZOLE (FLAGYL) 500 MG tablet Take 1 tablet (500 mg total) by mouth 2 (two) times daily. Finish all the medication. Do not drink alcohol while on medication. 10/31/21   Lurline Idol, FNP  naproxen (NAPROSYN) 500 MG tablet Take  1 tablet (500 mg total) by mouth 2 (two) times daily with a meal. 10/30/21   Wallis Bamberg, PA-C  oxyCODONE-acetaminophen (PERCOCET/ROXICET) 5-325 MG tablet Take 1 tablet by mouth every 8 (eight) hours as needed for severe pain. 08/08/17   McDonald, Mia A, PA-C  tiZANidine (ZANAFLEX) 4 MG tablet Take 1 tablet (4 mg total) by mouth at bedtime. 10/30/21   Wallis Bamberg, PA-C    Allergies    Patient has no known allergies.  Review of Systems   Review of Systems  Constitutional:  Negative for appetite change and fatigue.  HENT:  Negative for congestion.   Respiratory:  Negative for shortness of breath.   Cardiovascular:  Negative for chest pain.  Gastrointestinal:  Negative for abdominal pain.  Genitourinary:  Positive for flank pain and vaginal discharge.  Musculoskeletal:  Positive for back pain.  Skin:  Negative for rash.  Neurological:  Negative for weakness.  Psychiatric/Behavioral:  Negative for confusion.    Physical Exam Updated Vital Signs BP 122/79    Pulse 79    Temp 98.8 F (37.1 C) (Oral)    Resp 19    LMP 10/20/2021    SpO2 97%   Physical Exam Vitals and nursing note reviewed.  HENT:     Head: Normocephalic.  Cardiovascular:  Rate and Rhythm: Regular rhythm.  Abdominal:     Tenderness: There is no abdominal tenderness.  Musculoskeletal:        General: No tenderness.     Comments: Some tenderness on musculature on right lower back.  Skin:    Capillary Refill: Capillary refill takes less than 2 seconds.  Neurological:     Mental Status: She is alert and oriented to person, place, and time.    ED Results / Procedures / Treatments   Labs (all labs ordered are listed, but only abnormal results are displayed) Labs Reviewed  URINALYSIS, ROUTINE W REFLEX MICROSCOPIC - Abnormal; Notable for the following components:      Result Value   Color, Urine COLORLESS (*)    Specific Gravity, Urine 1.001 (*)    Hgb urine dipstick SMALL (*)    All other components within normal  limits  RESP PANEL BY RT-PCR (FLU A&B, COVID) ARPGX2  PREGNANCY, URINE    EKG None  Radiology No results found.  Procedures Procedures   Medications Ordered in ED Medications - No data to display  ED Course  I have reviewed the triage vital signs and the nursing notes.  Pertinent labs & imaging results that were available during my care of the patient were reviewed by me and considered in my medical decision making (see chart for details).    MDM Rules/Calculators/A&P                         Patient with lower back pain.  Headache more likely musculoskeletal.  Is also on Flagyl.  Recently had CT scan that showed renal stone without obstruction.  Doubt obstruction at this time.  We will add some Zofran for the nausea and vomiting.  Also COVID test done.  Will discharge home    Final Clinical Impression(s) / ED Diagnoses Final diagnoses:  Back pain, unspecified back location, unspecified back pain laterality, unspecified chronicity  Nausea and vomiting, unspecified vomiting type    Rx / DC Orders ED Discharge Orders          Ordered    ondansetron (ZOFRAN-ODT) 4 MG disintegrating tablet  Every 8 hours PRN        11/04/21 1959             Benjiman Core, MD 11/09/21 2337

## 2021-11-29 ENCOUNTER — Telehealth: Payer: Self-pay | Admitting: Physician Assistant

## 2021-11-29 DIAGNOSIS — R630 Anorexia: Secondary | ICD-10-CM

## 2021-11-29 DIAGNOSIS — M5442 Lumbago with sciatica, left side: Secondary | ICD-10-CM

## 2021-11-29 DIAGNOSIS — R5383 Other fatigue: Secondary | ICD-10-CM

## 2021-11-29 DIAGNOSIS — R531 Weakness: Secondary | ICD-10-CM

## 2021-11-30 NOTE — Progress Notes (Signed)
Based on what you shared with me, I feel your condition warrants further evaluation and I recommend that you be seen in a face to face visit.  Being that you are also having fatigue, weakness, and loss of appetite, there could be many other things going on that are not well evaluated by a virtual visit. Please seek in person evaluation as soon as possible.    NOTE: There will be NO CHARGE for this eVisit   If you are having a true medical emergency please call 911.      For an urgent face to face visit, [facility] has six urgent care centers for your convenience:     [facility] at [facility] Care Directions [phone] [address] [place], [place] [address]    [facility] Health Urgent Care Center [facility]) Get Driving Directions [phone] [address] [place], [place] [address]  [facility]) Get Driving Directions [phone] [address] [place],  [place]  [address]  [facility] Health Urgent Care at [facility] Get Driving Directions [phone] [address] [place], [place] [address]   [facility] Health Urgent Care at [facility] Get Driving Directions  [phone] [address] [place], [place] [address]   [facility] Health Urgent Care at [facility] Directions [phone] [address], [address] [place], [place] [address]  Your MyChart E-visit questionnaire answers were reviewed by a board certified advanced clinical practitioner to complete your personal care plan based on your specific symptoms.  Thank you for using e-Visits.   I provided 5 minutes of non face-to-face time during this encounter for chart review and documentation.

## 2021-12-01 ENCOUNTER — Emergency Department (HOSPITAL_COMMUNITY): Payer: Self-pay

## 2021-12-01 ENCOUNTER — Encounter (HOSPITAL_COMMUNITY): Payer: Self-pay | Admitting: Emergency Medicine

## 2021-12-01 ENCOUNTER — Other Ambulatory Visit: Payer: Self-pay

## 2021-12-01 ENCOUNTER — Emergency Department (HOSPITAL_COMMUNITY)
Admission: EM | Admit: 2021-12-01 | Discharge: 2021-12-01 | Disposition: A | Discharge status: Home / Self Care | Payer: Self-pay | Attending: Emergency Medicine | Admitting: Emergency Medicine

## 2021-12-01 DIAGNOSIS — M546 Pain in thoracic spine: Secondary | ICD-10-CM | POA: Insufficient documentation

## 2021-12-01 DIAGNOSIS — R0602 Shortness of breath: Secondary | ICD-10-CM | POA: Insufficient documentation

## 2021-12-01 DIAGNOSIS — N200 Calculus of kidney: Secondary | ICD-10-CM | POA: Insufficient documentation

## 2021-12-01 LAB — CBC WITH DIFFERENTIAL/PLATELET
Abs Immature Granulocytes: 0.03 10*3/uL (ref 0.00–0.07)
Basophils Absolute: 0 10*3/uL (ref 0.0–0.1)
Basophils Relative: 0 %
Eosinophils Absolute: 0 10*3/uL (ref 0.0–0.5)
Eosinophils Relative: 0 %
HCT: 40.8 % (ref 36.0–46.0)
Hemoglobin: 12.8 g/dL (ref 12.0–15.0)
Immature Granulocytes: 0 %
Lymphocytes Relative: 23 %
Lymphs Abs: 1.7 10*3/uL (ref 0.7–4.0)
MCH: 28.3 pg (ref 26.0–34.0)
MCHC: 31.4 g/dL (ref 30.0–36.0)
MCV: 90.3 fL (ref 80.0–100.0)
Monocytes Absolute: 0.4 10*3/uL (ref 0.1–1.0)
Monocytes Relative: 5 %
Neutro Abs: 5.1 10*3/uL (ref 1.7–7.7)
Neutrophils Relative %: 72 %
Platelets: 271 10*3/uL (ref 150–400)
RBC: 4.52 MIL/uL (ref 3.87–5.11)
RDW: 12.5 % (ref 11.5–15.5)
WBC: 7.2 10*3/uL (ref 4.0–10.5)
nRBC: 0 % (ref 0.0–0.2)

## 2021-12-01 LAB — URINALYSIS, ROUTINE W REFLEX MICROSCOPIC
Glucose, UA: NEGATIVE mg/dL
Ketones, ur: >80 mg/dL — AB
Leukocytes,Ua: NEGATIVE
Nitrite: NEGATIVE
Protein, ur: NEGATIVE mg/dL
Specific Gravity, Urine: >1.03 — ABNORMAL HIGH (ref 1.005–1.030)
pH: 6 (ref 5.0–8.0)

## 2021-12-01 LAB — COMPREHENSIVE METABOLIC PANEL
ALT: 10 U/L (ref 0–44)
AST: 18 U/L (ref 15–41)
Albumin: 4 g/dL (ref 3.5–5.0)
Alkaline Phosphatase: 63 U/L (ref 38–126)
Anion gap: 8 (ref 5–15)
BUN: 8 mg/dL (ref 6–20)
CO2: 23 mmol/L (ref 22–32)
Calcium: 9.1 mg/dL (ref 8.9–10.3)
Chloride: 105 mmol/L (ref 98–111)
Creatinine, Ser: 0.88 mg/dL (ref 0.44–1.00)
GFR, Estimated: >60 mL/min (ref >60)
Glucose, Bld: 85 mg/dL (ref 70–99)
Potassium: 3.3 mmol/L — ABNORMAL LOW (ref 3.5–5.1)
Sodium: 136 mmol/L (ref 135–145)
Total Bilirubin: 0.7 mg/dL (ref 0.3–1.2)
Total Protein: 7.5 g/dL (ref 6.5–8.1)

## 2021-12-01 LAB — URINALYSIS, MICROSCOPIC (REFLEX): Bacteria, UA: NONE SEEN

## 2021-12-01 LAB — PREGNANCY, URINE: Preg Test, Ur: NEGATIVE

## 2021-12-01 MED ORDER — CYCLOBENZAPRINE HCL 10 MG PO TABS: 10.0000 mg | ORAL_TABLET | Freq: Two times a day (BID) | ORAL | 0 refills | Status: AC | PRN

## 2021-12-01 MED ORDER — CYCLOBENZAPRINE HCL 10 MG PO TABS
10.0000 mg | ORAL_TABLET | Freq: Once | ORAL | Status: AC
  Administered 2021-12-01: 10 mg via ORAL
  Filled 2021-12-01: qty 1

## 2021-12-01 MED ORDER — KETOROLAC TROMETHAMINE 15 MG/ML IJ SOLN
15.0000 mg | Freq: Once | INTRAMUSCULAR | Status: DC
  Filled 2021-12-01: qty 1

## 2021-12-01 MED ORDER — NAPROXEN 375 MG PO TABS: 375.0000 mg | ORAL_TABLET | Freq: Two times a day (BID) | ORAL | 0 refills | Status: AC

## 2021-12-01 MED ORDER — KETOROLAC TROMETHAMINE 15 MG/ML IJ SOLN: 15.0000 mg | Freq: Once | INTRAMUSCULAR | Status: DC

## 2021-12-01 NOTE — ED Provider Notes (Signed)
[facility] EMERGENCY DEPARTMENT Provider Note   CSN: [id number] Arrival date & time: 12/01/21  1503     History  Chief Complaint  Patient presents with   Back Pain    Melanie Yang is a 26 y.o. female.  26 y.o female with a PMH of right flank an left side pain x 1 month.  Patient reports she has had this bilateral thoracic pain for the past month.  She has been seen multiple times in the past month for the same complaint.  She reports worsening pain when she sits up.  She describes the pain as a sharp sensation to her entire thoracic spine with radiation into the abdomen.  She did take 1 Advil yesterday without any relief.  She also reports feeling "warm, with a subjective fever.  She not been evaluated by any specialist due to financial strain.  She reports she is in too much pain in her will to move around.  She sometimes feels that the pain moves down her legs.  She is also endorsing some shortness of breath that occurs when she moves around.  Does not have any urinary symptoms, no nausea, no vomiting, no vaginal discharge.  No chest pain.  No prior history of blood clots.   The history is provided by the patient and medical records.  Back Pain Location:  Thoracic spine Associated symptoms: fever (subjective)   Associated symptoms: no chest pain       Home Medications Prior to Admission medications   Medication Sig Start Date End Date Taking? Authorizing Provider  cyclobenzaprine (FLEXERIL) 10 MG tablet Take 1 tablet (10 mg total) by mouth 2 (two) times daily as needed for up to 7 days for muscle spasms. 12/01/21 12/08/21 Yes Ciara Kagan, PA-C  ibuprofen (ADVIL) 200 MG tablet Take 200 mg by mouth every 6 (six) hours as needed for mild pain.   Yes [provider]  naproxen (NAPROSYN) 375 MG tablet Take 1 tablet (375 mg total) by mouth 2 (two) times daily for 7 days. 12/01/21 12/08/21 Yes Kensington Duerst, PA-C  lidocaine (XYLOCAINE) 2 % solution Use as  directed 15 mLs in the mouth or throat as needed for mouth pain. Patient not taking: Reported on 12/01/2021 10/01/21   Hans Eden, NP  metroNIDAZOLE (FLAGYL) 500 MG tablet Take 1 tablet (500 mg total) by mouth 2 (two) times daily. Finish all the medication. Do not drink alcohol while on medication. Patient not taking: Reported on 12/01/2021 10/31/21   Enrique Sack, FNP  ondansetron (ZOFRAN-ODT) 4 MG disintegrating tablet Take 1 tablet (4 mg total) by mouth every 8 (eight) hours as needed for nausea or vomiting. Patient not taking: Reported on 12/01/2021 11/04/21   Davonna Belling, MD  oxyCODONE-acetaminophen (PERCOCET/ROXICET) 5-325 MG tablet Take 1 tablet by mouth every 8 (eight) hours as needed for severe pain. Patient not taking: Reported on 12/01/2021 08/08/17   McDonald, Mia A, PA-C  tiZANidine (ZANAFLEX) 4 MG tablet Take 1 tablet (4 mg total) by mouth at bedtime. Patient not taking: Reported on 12/01/2021 10/30/21   Jaynee Eagles, PA-C      Allergies    Patient has no known allergies.    Review of Systems   Review of Systems  Constitutional:  Positive for fever (subjective). Negative for chills.  Respiratory:  Negative for shortness of breath.   Cardiovascular:  Negative for chest pain.  Gastrointestinal:  Negative for nausea and vomiting.  Genitourinary:  Negative for flank pain.  Musculoskeletal:  Positive for back pain.  Neurological:  Negative for light-headedness.  All other systems reviewed and are negative.  Physical Exam Updated Vital Signs BP 111/70    Pulse 84    Temp 98.5 F (36.9 C) (Oral)    Resp 16    Ht 4\' 11"  (1.499 m)    Wt 61.2 kg    SpO2 99%    BMI 27.27 kg/m  Physical Exam Vitals and nursing note reviewed.  Constitutional:      General: She is not in acute distress.    Appearance: She is well-developed.  HENT:     Head: Normocephalic and atraumatic.     Mouth/Throat:     Pharynx: No oropharyngeal exudate.  Eyes:     Pupils: Pupils are equal, round, and  reactive to light.  Cardiovascular:     Rate and Rhythm: Normal rate and regular rhythm.     Heart sounds: Normal heart sounds.  Pulmonary:     Effort: Pulmonary effort is normal. No respiratory distress.     Breath sounds: Normal breath sounds.  Abdominal:     General: Bowel sounds are normal. There is no distension.     Palpations: Abdomen is soft.     Tenderness: There is abdominal tenderness. There is right CVA tenderness and left CVA tenderness.  Musculoskeletal:        General: No tenderness or deformity.     Cervical back: Normal range of motion.     Right lower leg: No edema.     Left lower leg: No edema.  Skin:    General: Skin is warm and dry.  Neurological:     Mental Status: She is alert and oriented to person, place, and time.    ED Results / Procedures / Treatments   Labs (all labs ordered are listed, but only abnormal results are displayed) Labs Reviewed  URINALYSIS, ROUTINE W REFLEX MICROSCOPIC - Abnormal; Notable for the following components:      Result Value   Specific Gravity, Urine >1.030 (*)    Hgb urine dipstick MODERATE (*)    Bilirubin Urine SMALL (*)    Ketones, ur >80 (*)    All other components within normal limits  COMPREHENSIVE METABOLIC PANEL - Abnormal; Notable for the following components:   Potassium 3.3 (*)    All other components within normal limits  PREGNANCY, URINE  CBC WITH DIFFERENTIAL/PLATELET  URINALYSIS, MICROSCOPIC (REFLEX)    EKG None  Radiology CT Renal Stone Study  Result Date: 12/01/2021 CLINICAL DATA:  Bilateral flank pain EXAM: CT ABDOMEN AND PELVIS WITHOUT CONTRAST TECHNIQUE: Multidetector CT imaging of the abdomen and pelvis was performed following the standard protocol without IV contrast. COMPARISON:  10/31/2021 FINDINGS: Lower chest: Lung bases are clear. No effusions. Heart is normal size. Hepatobiliary: No focal hepatic abnormality. Gallbladder unremarkable. Pancreas: No focal abnormality or ductal dilatation.  Spleen: No focal abnormality.  Normal size. Adrenals/Urinary Tract: 7 mm stone in the midpole of the right kidney. Punctate 1-2 mm stone in the lower pole of the right kidney. No ureteral stones or hydronephrosis. Adrenal glands and urinary bladder unremarkable. Stomach/Bowel: Normal appendix. Stomach, large and small bowel grossly unremarkable. Vascular/Lymphatic: No evidence of aneurysm or adenopathy. Reproductive: Uterus and adnexa unremarkable.  No mass. Other: No free fluid or free air. Musculoskeletal: No acute bony abnormality. IMPRESSION: Right nephrolithiasis. No acute findings in the abdomen or pelvis. Electronically Signed   By: Rolm Baptise M.D.   On: 12/01/2021 19:02    Procedures  Procedures    Medications Ordered in ED Medications  ketorolac (TORADOL) 15 MG/ML injection 15 mg (15 mg Intramuscular Patient Refused/Not Given 12/01/21 2034)  cyclobenzaprine (FLEXERIL) tablet 10 mg (10 mg Oral Given 12/01/21 1847)    ED Course/ Medical Decision Making/ A&P Clinical Course as of 12/01/21 2130  Sat Dec 01, 2021  2015 Hgb urine dipstickMarland Kitchen): MODERATE [JS]    Clinical Course User Index [JS] Janeece Fitting, PA-C                           Medical Decision Making  Presents to the ED with ongoing thoracic pain for the past month.  Previously evaluated in the emergency department but did not find a cause of her pain.  She has not follow-up with any PCP or A specialist.  On today's visit she reports worsening pain to the thoracic spine, feels that the pain is exacerbated with sitting down, any movement.  She has tried 1 Advil yesterday without any improvement in symptoms.  She is without any nausea, vomiting but does have a subjective fever.  No prior intervention to her abdomen.  She is eating and drinking adequately.  No urinary symptoms such as hematuria, urgency or frequency.  During evaluation patient does have some right-sided CVA tenderness, no left-sided is appreciated.  Abdomen is soft,  mildly tender to palpation along the right upper quadrant.  Upper and lower extremities, her lungs are clear to auscultation without any rales or wheezing.  Interpretation of her labs by me revealed a CMP with no electrolyte derangement aside from decreased potassium.  Creatinine levels within normal limits.  LFTs unremarkable without any pain along the right upper quadrant or reflux.  CBC with no leukocytosis, hemoglobin is within normal limits.  UA with moderate hemoglobin, slight bilirubin noted, I have some suspicion for renal colic at this time.  Her pregnancy test is also negative.  Blood cell count is 6-10, without any signs of infection.    CT Renal showed:  Right nephrolithiasis.     No acute findings in the abdomen or pelvis.      Results were discussed with patient at length.  I did review her chart extensively, I do see she received a CT renal in the past where the stone was noted to be 6 mm.  Although we discussed this likely is not causing most of her pain.  I will provide her with a referral to urology in order to have further assessment.  She is agreeable of this at this time.  We will also trial some anti-inflammatories along with muscle relaxers to help with her pain.  She is agreeable to plan and treatment at this time, patient is hemodynamically stable for discharge.   Portions of this note were generated with [employer]. Dictation errors may occur despite best attempts at proofreading.   Final Clinical Impression(s) / ED Diagnoses Final diagnoses:  Acute bilateral thoracic back pain    Rx / DC Orders ED Discharge Orders          Ordered    cyclobenzaprine (FLEXERIL) 10 MG tablet  2 times daily PRN        12/01/21 2124    naproxen (NAPROSYN) 375 MG tablet  2 times daily        12/01/21 2124              Janeece Fitting, PA-C 12/01/21 2130    Drenda Freeze, MD 12/02/21  1455 ° °

## 2021-12-01 NOTE — Discharge Instructions (Signed)
We discussed results of your CT imaging on today's visit, you were provided with a copy of your CAT scan.  I have provided a prescription for medication to help with your pain.  Please take 1 tablet twice a day for the next 7 days with food.  The second medication is called Flexeril, this will help with muscle spasms.  Do not drink alcohol or have while taking this medication as it can make you drowsy.  The phone number to the [facility] health and wellness clinic is attached to your chart, please schedule an appointment in order to obtain primary care.

## 2021-12-01 NOTE — ED Provider Triage Note (Signed)
Emergency Medicine Provider Triage Evaluation Note  Melanie Yang , a 26 y.o. female  was evaluated in triage.  Pt complains of back pain that has not resolved since her visits in December.  She was noted to have small stones in the pole of her right kidney on the seventh.  She returned on the 11th for continued back pain and it was found to be musculoskeletal.  She had a virtual visit with primary care 2 days ago and they stated she should have an in person visit.  Presenting today with continued pain, mostly on the right side.  Review of Systems  Positive: Right back and flank pain Negative: Dysuria, hematuria, nausea or vomiting  Physical Exam  BP (!) 132/100 (BP Location: Right Arm)    Pulse (!) 113    Temp 98.3 F (36.8 C) (Oral)    Resp 16    Ht 4\' 11"  (1.499 m)    Wt 61.2 kg    SpO2 99%    BMI 27.27 kg/m  Gen:   Awake, no distress   Resp:  Normal effort  MSK:   Moves extremities without difficulty  Other:  Right-sided CVA tenderness, tender to palpation of right paraspinal muscles as well.  Medical Decision Making  Medically screening exam initiated at 4:22 PM.  Appropriate orders placed.  Khai Germano was informed that the remainder of the evaluation will be completed by another provider, this initial triage assessment does not replace that evaluation, and the importance of remaining in the ED until their evaluation is complete.     Rhae Hammock, PA-C 12/01/21 1624

## 2021-12-01 NOTE — ED Notes (Signed)
Got patient on the monitor patient is resting with family at beside and call bell in reach

## 2021-12-01 NOTE — ED Triage Notes (Signed)
Pt endorses mid upper back pain for a month. No relief with advil. Denies any trauma. Denies urinary symptoms.

## 2021-12-03 ENCOUNTER — Ambulatory Visit: Payer: Self-pay

## 2021-12-03 NOTE — Telephone Encounter (Signed)
Summary: Clinical advice   Pt called and stated that she would like a call back from the nurse for clinical advice. Pt is unable to get into dr office until march and is concerned about that. Please advise       Chief Complaint: requesting earlier appt due to possible Kidney stones and repeat visit to ED. Symptoms: low middle and side back pain , multiple kidney stones reported . Low potassium  Frequency: since December 7th 2022 Pertinent Negatives: Patient denies fever, urinary pain or issues .  Disposition: [] ED /[] Urgent Care (no appt availability in office) / [x] Appointment(In office/virtual)/ []  [facility]/ [] Home Care/ [] Refused Recommended Disposition /[] [facility]/ []  Follow-up with PCP Additional Notes:  Requesting earlier appt for new patient due to ED advised patient to see PCP for management of possible kidney stones and repeat visits to ED. Appt scheduled in March . Please advise . Patient aware to seek help in ED if symptoms worsen.         Reason for Disposition  [1] MODERATE back pain (e.g., interferes with normal activities) AND [2] present > 3 days  Answer Assessment - Initial Assessment Questions 1. ONSET: "When did the pain begin?"      December 7th  2. LOCATION: "Where does it hurt?" (upper, mid or lower back)     Back middle to sides  3. SEVERITY: "How bad is the pain?"  (e.g., Scale 1-10; mild, moderate, or severe)   - MILD (1-3): doesn't interfere with normal activities    - MODERATE (4-7): interferes with normal activities or awakens from sleep    - SEVERE (8-10): excruciating pain, unable to do any normal activities      moderate 4. PATTERN: "Is the pain constant?" (e.g., yes, no; constant, intermittent)      Comes and goes  5. RADIATION: "Does the pain shoot into your legs or elsewhere?"     Buttocks and  right leg 6. CAUSE:  "What do you think is causing the back pain?"      Kidney stones  7. BACK OVERUSE:  "Any recent  lifting of heavy objects, strenuous work or exercise?"     no 8. MEDICATIONS: "What have you taken so far for the pain?" (e.g., nothing, acetaminophen, NSAIDS)     Flexeril  9. NEUROLOGIC SYMPTOMS: "Do you have any weakness, numbness, or problems with bowel/bladder control?"     No  10. OTHER SYMPTOMS: "Do you have any other symptoms?" (e.g., fever, abdominal pain, burning with urination, blood in urine)       Ketones in urine  11. PREGNANCY: "Is there any chance you are pregnant?" (e.g., yes, no; LMP)       na  Protocols used: Back Pain-A-AH

## 2021-12-03 NOTE — Telephone Encounter (Signed)
Pt called and stated that she would like a call back from the nurse for clinical advice. Pt is unable to get into dr office until march and is concerned about that. Please advise   Left message to call back.

## 2021-12-04 NOTE — Telephone Encounter (Signed)
Unable to call or provide advise to a non establish patient

## 2021-12-05 ENCOUNTER — Telehealth: Payer: Self-pay

## 2021-12-05 NOTE — Telephone Encounter (Signed)
Pt was given results from 12/01/21. Pt is concerned about urinalysis and is still in a lot of pain and unable to do normal activities. Attempted to move up pt's NPA but nothing availabe. Pt was encouraged to go to mobile unit on 12/11/21 and be seen. Pt was sent a mychart message with address and location for 12/11/21.

## 2021-12-21 ENCOUNTER — Encounter: Payer: Self-pay | Admitting: Family Medicine

## 2022-02-04 ENCOUNTER — Ambulatory Visit: Payer: Self-pay | Admitting: Family Medicine

## 2022-04-12 ENCOUNTER — Ambulatory Visit (INDEPENDENT_AMBULATORY_CARE_PROVIDER_SITE_OTHER): Payer: Self-pay | Admitting: Primary Care

## 2022-04-25 ENCOUNTER — Other Ambulatory Visit: Payer: Self-pay

## 2022-04-25 ENCOUNTER — Encounter (HOSPITAL_COMMUNITY): Payer: Self-pay

## 2022-04-25 ENCOUNTER — Emergency Department (HOSPITAL_COMMUNITY): Payer: Self-pay

## 2022-04-25 ENCOUNTER — Emergency Department (HOSPITAL_COMMUNITY)
Admission: EM | Admit: 2022-04-25 | Discharge: 2022-04-25 | Disposition: A | Discharge status: Home / Self Care | Payer: Self-pay | Attending: Emergency Medicine | Admitting: Emergency Medicine

## 2022-04-25 DIAGNOSIS — W232XXA Caught, crushed, jammed or pinched between a moving and stationary object, initial encounter: Secondary | ICD-10-CM | POA: Insufficient documentation

## 2022-04-25 DIAGNOSIS — S60042A Contusion of left ring finger without damage to nail, initial encounter: Secondary | ICD-10-CM | POA: Insufficient documentation

## 2022-04-25 NOTE — ED Triage Notes (Signed)
Pt arrived POV from home c/o a possible left broken ring finger. Pt states she accidentally hit it on the door. Pt has obvious swelling the the ring finger.

## 2022-04-25 NOTE — Discharge Instructions (Addendum)
Your x-ray today did not show any fractures.  Please apply ice and take Tylenol or Motrin for pain  The finger is expected to be swollen and you will have difficulty bending it for several days.  If you have persistent pain in the week, please contact the hand doctor to get a repeat x-ray  Return to ER if you have worse finger swelling, uncontrolled pain

## 2022-04-25 NOTE — ED Provider Triage Note (Signed)
Emergency Medicine Provider Triage Evaluation Note  Melanie Yang , a 26 y.o. female  was evaluated in triage.  Pt complains of left ring finger pain after slamming finger in car door.    Review of Systems  Positive:  Negative:   Physical Exam  BP 114/79 (BP Location: Right Arm)   Pulse (!) 108   Temp 98.9 F (37.2 C) (Oral)   Resp 16   Ht 4\' 11"  (1.499 m)   Wt 68 kg   SpO2 97%   BMI 30.30 kg/m  Gen:   Awake, no distress   Resp:  Normal effort  MSK:   Moves extremities without difficulty  Other:  Bruising noted  Medical Decision Making  Medically screening exam initiated at 5:15 PM.  Appropriate orders placed.  Melanie Yang was informed that the remainder of the evaluation will be completed by another provider, this initial triage assessment does not replace that evaluation, and the importance of remaining in the ED until their evaluation is complete.     , PA-C 04/25/22 [phone]

## 2022-04-25 NOTE — ED Provider Notes (Signed)
[facility] EMERGENCY DEPARTMENT Provider Note   CSN: [id number] Arrival date & time: 04/25/22  1654     History  Chief Complaint  Patient presents with   Finger Injury    Melanie Yang is a 26 y.o. female here presenting with left fourth finger injury.  Patient accidentally slammed the car door onto the left fourth finger.  She noticed significant swelling and has difficulty bending the finger.  Denies any other injuries  The history is provided by the patient.      Home Medications Prior to Admission medications   Medication Sig Start Date End Date Taking? Authorizing Provider  ibuprofen (ADVIL) 200 MG tablet Take 200 mg by mouth every 6 (six) hours as needed for mild pain.    [provider]  lidocaine (XYLOCAINE) 2 % solution Use as directed 15 mLs in the mouth or throat as needed for mouth pain. Patient not taking: Reported on 12/01/2021 10/01/21   Hans Eden, NP  metroNIDAZOLE (FLAGYL) 500 MG tablet Take 1 tablet (500 mg total) by mouth 2 (two) times daily. Finish all the medication. Do not drink alcohol while on medication. Patient not taking: Reported on 12/01/2021 10/31/21   Enrique Sack, FNP  ondansetron (ZOFRAN-ODT) 4 MG disintegrating tablet Take 1 tablet (4 mg total) by mouth every 8 (eight) hours as needed for nausea or vomiting. Patient not taking: Reported on 12/01/2021 11/04/21   Davonna Belling, MD  oxyCODONE-acetaminophen (PERCOCET/ROXICET) 5-325 MG tablet Take 1 tablet by mouth every 8 (eight) hours as needed for severe pain. Patient not taking: Reported on 12/01/2021 08/08/17   McDonald, Mia A, PA-C  tiZANidine (ZANAFLEX) 4 MG tablet Take 1 tablet (4 mg total) by mouth at bedtime. Patient not taking: Reported on 12/01/2021 10/30/21   Jaynee Eagles, PA-C      Allergies    Patient has no known allergies.    Review of Systems   Review of Systems  Musculoskeletal:        Left fourth finger pain  All other systems reviewed and are  negative.  Physical Exam Updated Vital Signs BP 114/79 (BP Location: Right Arm)   Pulse (!) 108   Temp 98.9 F (37.2 C) (Oral)   Resp 16   Ht 4\' 11"  (1.499 m)   Wt 68 kg   SpO2 97%   BMI 30.30 kg/m  Physical Exam Vitals and nursing note reviewed.  HENT:     Head: Normocephalic.     Nose: Nose normal.     Mouth/Throat:     Mouth: Mucous membranes are moist.  Eyes:     Pupils: Pupils are equal, round, and reactive to light.  Cardiovascular:     Rate and Rhythm: Normal rate.     Pulses: Normal pulses.  Pulmonary:     Effort: Pulmonary effort is normal.  Abdominal:     General: Abdomen is flat.  Musculoskeletal:     Cervical back: Normal range of motion.     Comments: Left fourth finger swelling and difficulty flexing the finger.  Patient has normal capillary refill.  There is no obvious subungual hematoma.  No other tenderness in the hand.  Neurological:     General: No focal deficit present.     Mental Status: She is alert and oriented to person, place, and time.  Psychiatric:        Mood and Affect: Mood normal.        Behavior: Behavior normal.    ED Results /  Procedures / Treatments   Labs (all labs ordered are listed, but only abnormal results are displayed) Labs Reviewed - No data to display  EKG None  Radiology DG Finger Ring Left  Result Date: 04/25/2022 CLINICAL DATA:  Status post trauma. EXAM: LEFT RING FINGER 2+V COMPARISON:  None Available. FINDINGS: There is no evidence of fracture or dislocation. There is no evidence of arthropathy or other focal bone abnormality. There is mild diffuse soft tissue swelling. IMPRESSION: Mild diffuse soft tissue swelling without an acute osseous abnormality. Electronically Signed   By: Virgina Norfolk M.D.   On: 04/25/2022 18:13    Procedures Procedures    Medications Ordered in ED Medications - No data to display  ED Course/ Medical Decision Making/ A&P                           Medical Decision  Making Melanie Yang is a 26 y.o. female here presenting with left fourth finger injury.  There is no obvious subungual hematoma patient has significant swelling and has difficulty bending the finger.  X-ray did not show any fracture.  I think likely contusion.  I recommend Tylenol or Motrin and ice.  I told her that if she has persistent pain, she can follow-up with hand surgery to get a repeat x-ray   Problems Addressed: Contusion of left ring finger without damage to nail, initial encounter: acute illness or injury  Amount and/or Complexity of Data Reviewed Radiology: ordered and independent interpretation performed. Decision-making details documented in ED Course.    Final Clinical Impression(s) / ED Diagnoses Final diagnoses:  Contusion of left ring finger without damage to nail, initial encounter    Rx / DC Orders ED Discharge Orders     None         Drenda Freeze, MD 04/25/22 [phone]

## 2022-05-27 IMAGING — CT CT RENAL STONE PROTOCOL
2 of 4 series · 17 of 46 positions shown, 19 images · non-contrast
Comparison: 10/31/2021

CLINICAL DATA: Bilateral flank pain

EXAM:
CT ABDOMEN AND PELVIS WITHOUT CONTRAST
TECHNIQUE: Multidetector CT imaging of the abdomen and pelvis was performed
following the standard protocol without IV contrast.

[Series 3: ap without · axial · non-contrast · 0.81mm/px · z∈[+889,+1244]mm · 14 of 81 slices shown, 16 images]
[im 5/81  soft-tissue]
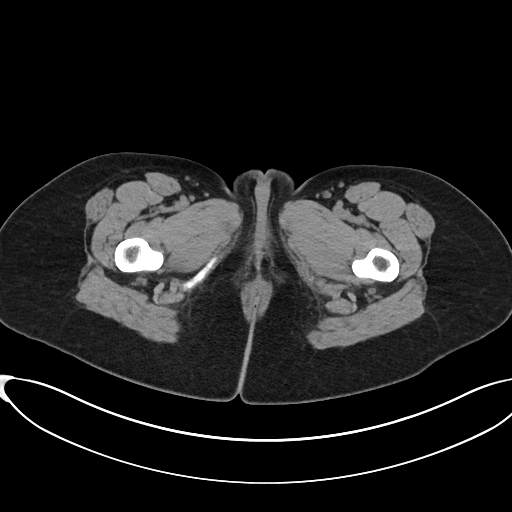
[im 5/81  bone]
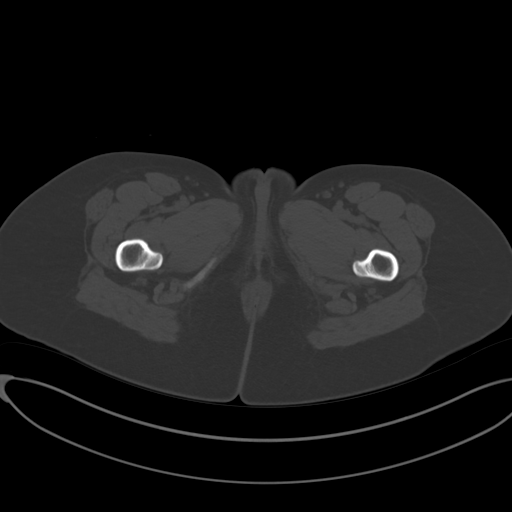
[im 10/81  soft-tissue]
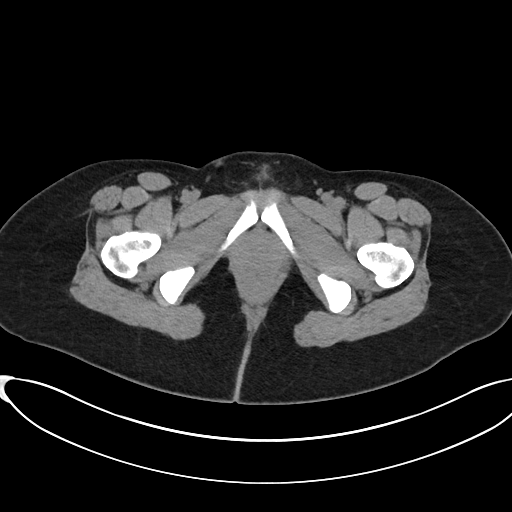
[im 15/81  soft-tissue]
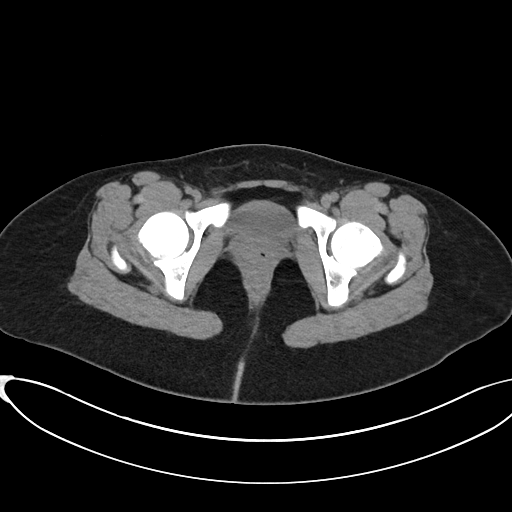
[im 24/81  soft-tissue]
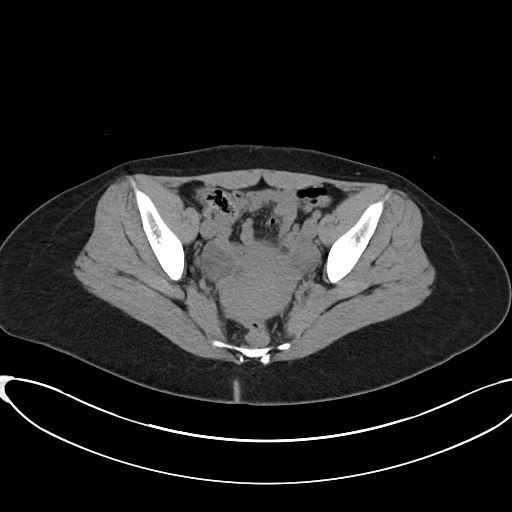
[im 29/81  soft-tissue]
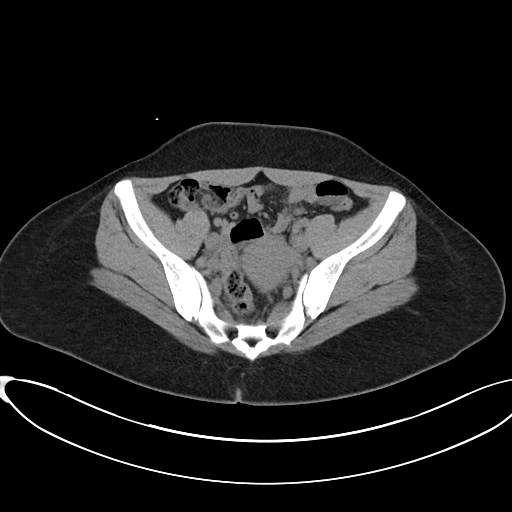
[im 33/81  soft-tissue]
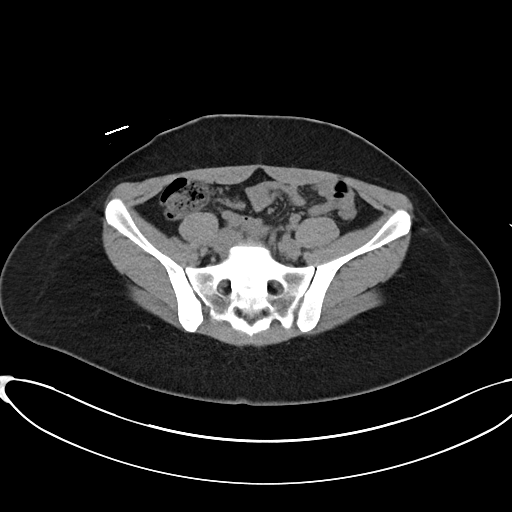
[im 38/81  soft-tissue]
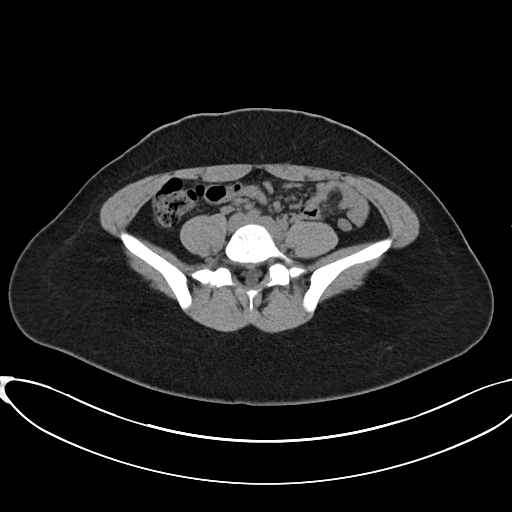
[im 43/81  soft-tissue]
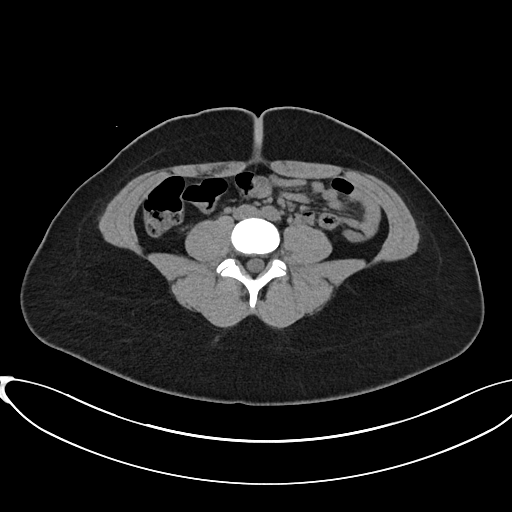
[im 48/81  soft-tissue]
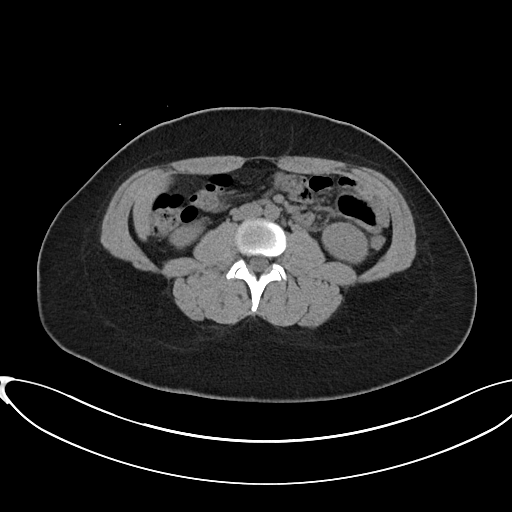
[im 48/81  bone]
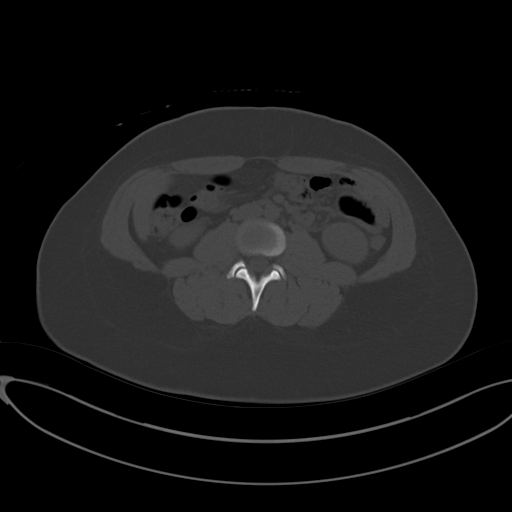
[im 52/81  soft-tissue]
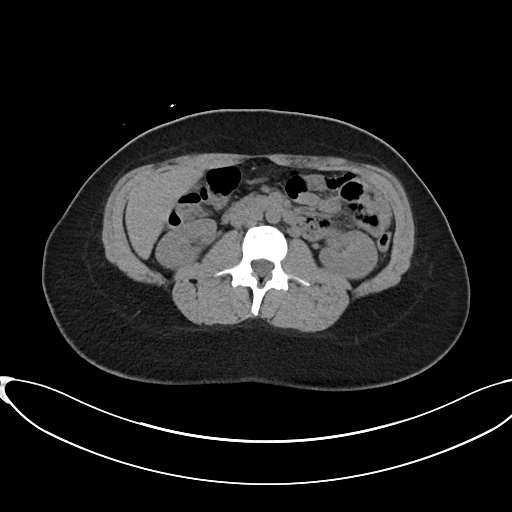
[im 62/81  soft-tissue]
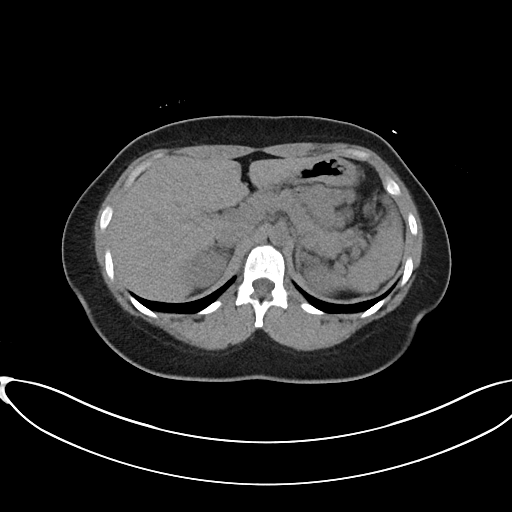
[im 66/81  soft-tissue]
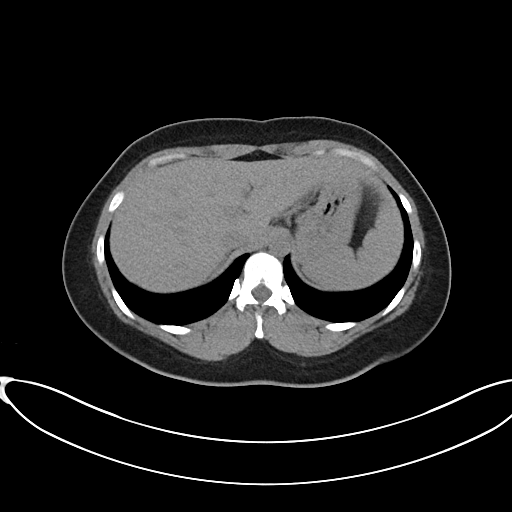
[im 71/81  soft-tissue]
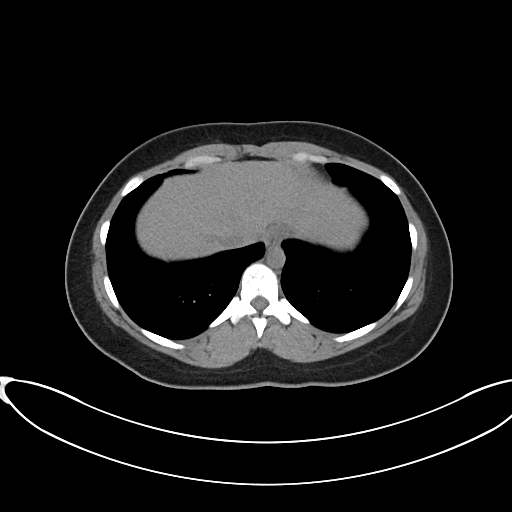
[im 76/81  soft-tissue]
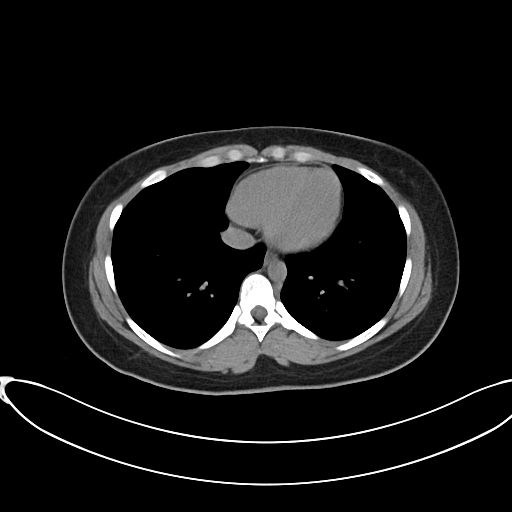

[Series 6: cor · coronal · 0.81mm/px · 3 of 84 slices shown]
[im 28/84  soft-tissue]
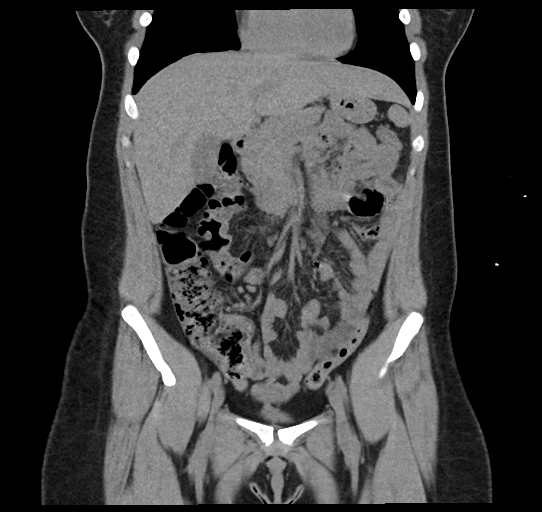
[im 37/84  soft-tissue]
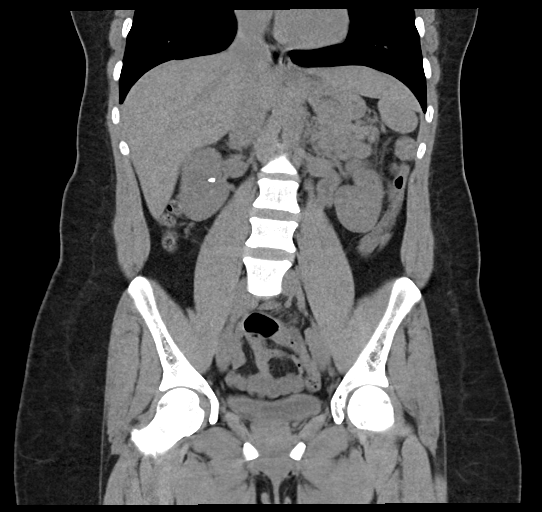
[im 47/84  soft-tissue]
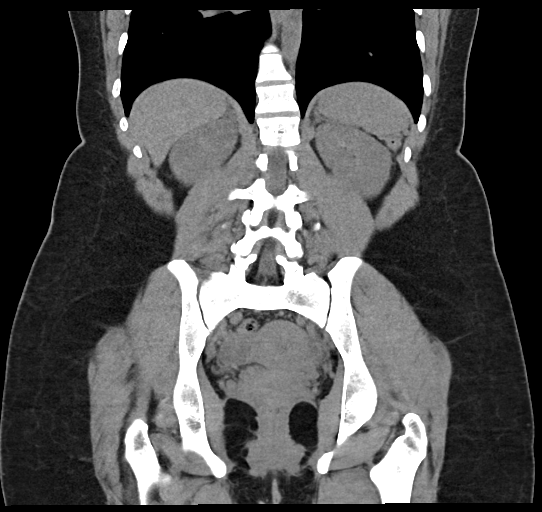

[17 of 46 positions shown; findings below may reference images not displayed]

FINDINGS: Lower chest: Lung bases are clear. No effusions. Heart is normal
size.

Hepatobiliary: No focal hepatic abnormality. Gallbladder
unremarkable.

Pancreas: No focal abnormality or ductal dilatation.

Spleen: No focal abnormality.  Normal size.

Adrenals/Urinary Tract: 7 mm stone in the midpole of the right
kidney. Punctate 1-2 mm stone in the lower pole of the right kidney.
No ureteral stones or hydronephrosis. Adrenal glands and urinary
bladder unremarkable.

Stomach/Bowel: Normal appendix. Stomach, large and small bowel
grossly unremarkable.

Vascular/Lymphatic: No evidence of aneurysm or adenopathy.

Reproductive: Uterus and adnexa unremarkable.  No mass.

Other: No free fluid or free air.

Musculoskeletal: No acute bony abnormality.
IMPRESSION: Right nephrolithiasis.

No acute findings in the abdomen or pelvis.

## 2022-10-19 IMAGING — CR DG FINGER RING 2+V*L*
3 series · 3 of 3 positions shown · non-contrast
Comparison: None Available.

CLINICAL DATA: Status post trauma.

EXAM:
LEFT RING FINGER 2+V

[finger ap]
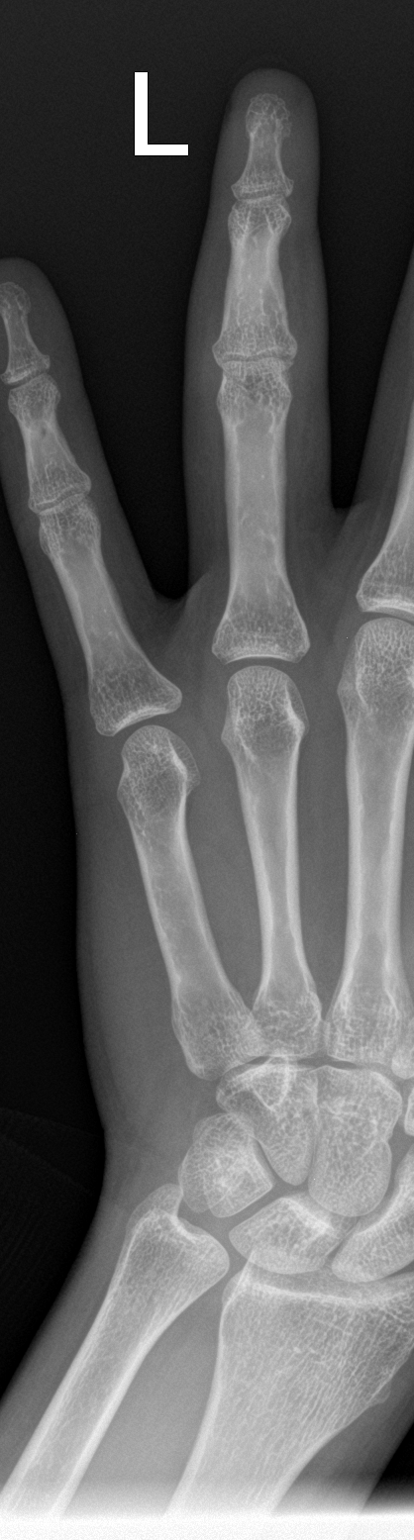

[finger obl]
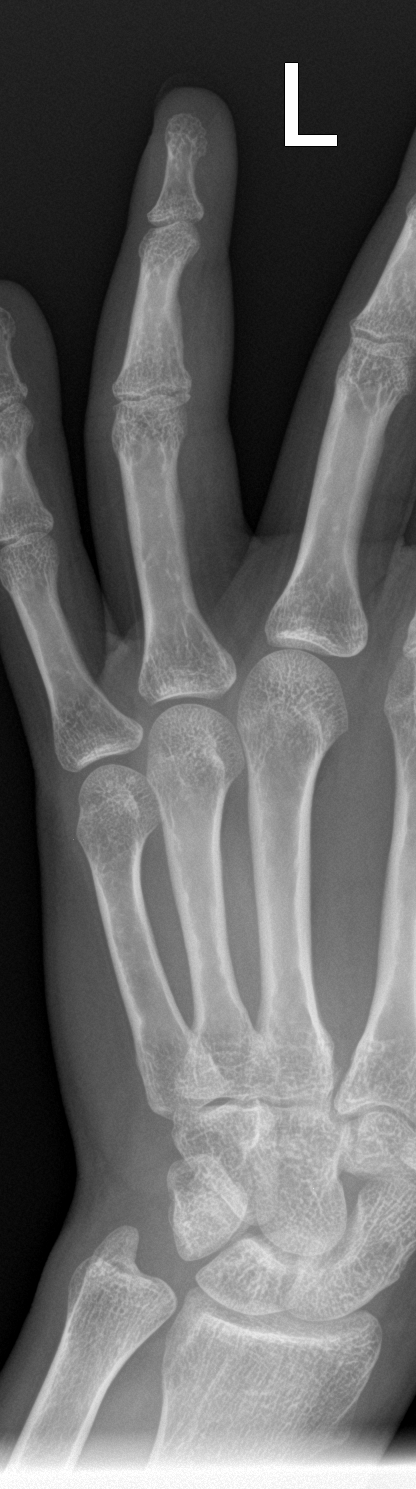

[finger lat]
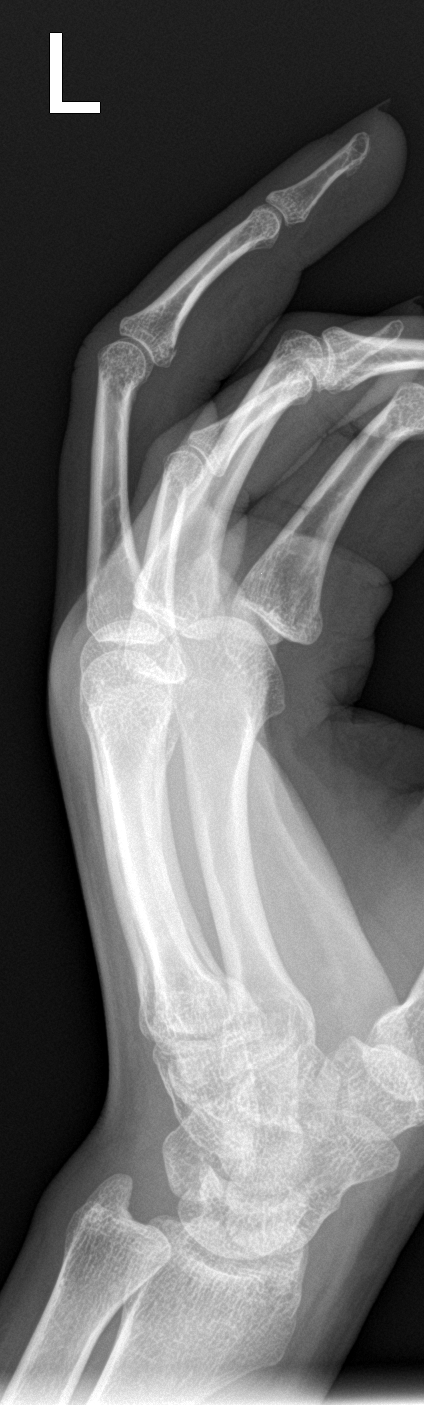

[3 of 3 positions shown; findings below may reference images not displayed]

FINDINGS: There is no evidence of fracture or dislocation. There is no
evidence of arthropathy or other focal bone abnormality. There is
mild diffuse soft tissue swelling.
IMPRESSION: Mild diffuse soft tissue swelling without an acute osseous
abnormality.

## 2023-08-13 ENCOUNTER — Other Ambulatory Visit (HOSPITAL_COMMUNITY): Payer: Self-pay

## 2023-08-13 ENCOUNTER — Other Ambulatory Visit: Payer: Self-pay

## 2023-08-13 ENCOUNTER — Ambulatory Visit (HOSPITAL_COMMUNITY)
Admission: EM | Admit: 2023-08-13 | Discharge: 2023-08-13 | Disposition: A | Discharge status: Home / Self Care | Payer: 59 | Attending: Family Medicine | Admitting: Family Medicine

## 2023-08-13 ENCOUNTER — Encounter (HOSPITAL_COMMUNITY): Payer: Self-pay | Admitting: *Deleted

## 2023-08-13 DIAGNOSIS — K0889 Other specified disorders of teeth and supporting structures: Secondary | ICD-10-CM | POA: Diagnosis not present

## 2023-08-13 MED ORDER — AMOXICILLIN-POT CLAVULANATE 875-125 MG PO TABS
1.0000 | ORAL_TABLET | Freq: Two times a day (BID) | ORAL | 0 refills | Status: AC
  Filled 2023-08-13 (×2): qty 14, 7d supply, fill #0

## 2023-08-13 MED ORDER — IBUPROFEN 800 MG PO TABS
800.0000 mg | ORAL_TABLET | Freq: Three times a day (TID) | ORAL | 0 refills | Status: AC
Start: 2023-08-13 — End: ?
  Filled 2023-08-13 (×2): qty 21, 7d supply, fill #0

## 2023-08-13 NOTE — ED Provider Notes (Signed)
  [facility]   [id number] 08/13/23 Arrival Time: 1452  ASSESSMENT & PLAN:  1. Pain, dental    No sign of abscess requiring I&D at this time. Discussed.  Meds ordered this encounter  Medications   amoxicillin-clavulanate (AUGMENTIN) 875-125 MG tablet    Sig: Take 1 tablet by mouth every 12 (twelve) hours.    Dispense:  14 tablet    Refill:  0   ibuprofen (ADVIL) 800 MG tablet    Sig: Take 1 tablet (800 mg total) by mouth 3 (three) times daily with meals.    Dispense:  21 tablet    Refill:  0    Reviewed expectations re: course of current medical issues. Questions answered. Outlined signs and symptoms indicating need for more acute intervention. Patient verbalized understanding. After Visit Summary given.   SUBJECTIVE:  Melanie Yang is a 27 y.o. female who reports left upper dental pain; few days; denies fever. OTC without relief.  OBJECTIVE: Vitals:   08/13/23 1628  BP: 124/77  Pulse: (!) 104  Resp: 18  Temp: 98.6 F (37 C)  SpO2: 98%    General appearance: alert; no distress HENT: normocephalic; atraumatic; dentition: good; left upper gum without areas of fluctuance, drainage, or bleeding and with tenderness to palpation; normal jaw movement without difficulty Neck: supple without LAD; FROM; trachea midline Lungs: normal respirations; unlabored; speaks full sentences without difficulty Skin: warm and dry Psychological: alert and cooperative; normal mood and affect  No Known Allergies  Past Medical History:  Diagnosis Date   Asthma    Bronchitis    Kidney stone    Sinus trouble    Social History   Socioeconomic History   Marital status: Single    Spouse name: Not on file   Number of children: Not on file   Years of education: Not on file   Highest education level: Not on file  Occupational History   Not on file  Tobacco Use   Smoking status: Never   Smokeless tobacco: Not on file  Substance and Sexual Activity   Alcohol use: No    Drug use: No   Sexual activity: Not on file  Other Topics Concern   Not on file  Social History Narrative   Not on file   Social Determinants of Health   Financial Resource Strain: Not on file  Food Insecurity: Not on file  Transportation Needs: Not on file  Physical Activity: Not on file  Stress: Not on file  Social Connections: Not on file  Intimate Partner Violence: Not on file   History reviewed. No pertinent family history. History reviewed. No pertinent surgical history.    Mardella Layman, MD 08/13/23 [phone]

## 2023-08-13 NOTE — ED Triage Notes (Signed)
Pt reports having a broken tooth on upper left side. Yesterday Pt reports Lt sided facial swelling started.

## 2023-08-14 ENCOUNTER — Telehealth (HOSPITAL_COMMUNITY): Payer: Self-pay | Admitting: Emergency Medicine

## 2023-08-14 ENCOUNTER — Other Ambulatory Visit (HOSPITAL_COMMUNITY): Payer: Self-pay

## 2023-08-14 NOTE — Telephone Encounter (Signed)
Returned pt call regarding question about bruise being caused by antibiotic.

## 2023-08-15 ENCOUNTER — Encounter (HOSPITAL_COMMUNITY): Payer: Self-pay

## 2023-08-15 ENCOUNTER — Ambulatory Visit (HOSPITAL_COMMUNITY)
Admission: EM | Admit: 2023-08-15 | Discharge: 2023-08-15 | Disposition: A | Discharge status: Home / Self Care | Payer: 59 | Attending: Emergency Medicine | Admitting: Emergency Medicine

## 2023-08-15 DIAGNOSIS — S8012XA Contusion of left lower leg, initial encounter: Secondary | ICD-10-CM | POA: Diagnosis not present

## 2023-08-15 DIAGNOSIS — E860 Dehydration: Secondary | ICD-10-CM | POA: Diagnosis not present

## 2023-08-15 DIAGNOSIS — Z1152 Encounter for screening for COVID-19: Secondary | ICD-10-CM | POA: Insufficient documentation

## 2023-08-15 DIAGNOSIS — X58XXXA Exposure to other specified factors, initial encounter: Secondary | ICD-10-CM | POA: Diagnosis not present

## 2023-08-15 DIAGNOSIS — R5383 Other fatigue: Secondary | ICD-10-CM | POA: Diagnosis not present

## 2023-08-15 LAB — POCT URINE PREGNANCY: Preg Test, Ur: NEGATIVE

## 2023-08-15 LAB — POCT URINALYSIS DIP (MANUAL ENTRY)
Glucose, UA: NEGATIVE mg/dL
Nitrite, UA: NEGATIVE
Protein Ur, POC: 30 mg/dL — AB
Spec Grav, UA: >=1.03 — AB (ref 1.010–1.025)
Urobilinogen, UA: 4 E.U./dL — AB
pH, UA: 5.5 (ref 5.0–8.0)

## 2023-08-15 NOTE — ED Triage Notes (Signed)
Pt states started antibiotics 2 days ago for a dental abscess. States "my body doesn't feel right. I have a random bruise on my thigh and my body gets heated." Pt states has only taken 2 doses.

## 2023-08-15 NOTE — Discharge Instructions (Addendum)
Please continue the Augmentin as prescribed, and finish the course. Take with food to avoid upset stomach.  Your urine shows signs of dehydration. Drink LOTS of water!! It's very important to stay hydrated while you are feeling sick Try to get plenty of rest You can use ibuprofen/tylenol for aches or fever  We will call you if your covid test returns positive.   Monitor symptoms for a few days. If worsening or severe, please return or be seen in the emergency department

## 2023-08-15 NOTE — ED Provider Notes (Signed)
[facility]    CSN: [id number] Arrival date & time: 08/15/23  4098     History   Chief Complaint Chief Complaint  Patient presents with   Allergic Reaction    HPI Melanie Yang is a 27 y.o. female.  Seen 2 days ago for dental pain, placed on augmentin for infection coverage. She has taken 2 pills so far. She is concerned about a bruise on her thigh and is wondering if this is related to the antibiotic.  Also stating her body feels warm. No temp measured at home She is reporting fatigue, weakness  She is not having any nausea, vomiting, abdominal pain, diarrhea, rash, shortness of breath, oral swelling.  She has not taken any other medicines Reports not drinking fluids  Past Medical History:  Diagnosis Date   Asthma    Bronchitis    Kidney stone    Sinus trouble     There are no problems to display for this patient.   History reviewed. No pertinent surgical history.  OB History   No obstetric history on file.      Home Medications    Prior to Admission medications   Medication Sig Start Date End Date Taking? Authorizing Provider  amoxicillin-clavulanate (AUGMENTIN) 875-125 MG tablet Take 1 tablet by mouth every 12 (twelve) hours. 08/13/23   Mardella Layman, MD  ibuprofen (ADVIL) 800 MG tablet Take 1 tablet (800 mg total) by mouth 3 (three) times daily with meals. 08/13/23   Mardella Layman, MD    Family History History reviewed. No pertinent family history.  Social History Social History   Tobacco Use   Smoking status: Never  Substance Use Topics   Alcohol use: No   Drug use: No     Allergies   Patient has no known allergies.   Review of Systems Review of Systems As per HPI  Physical Exam Triage Vital Signs ED Triage Vitals  Encounter Vitals Group     BP      Systolic BP Percentile      Diastolic BP Percentile      Pulse      Resp      Temp      Temp src      SpO2      Weight      Height      Head Circumference      Peak  Flow      Pain Score      Pain Loc      Pain Education      Exclude from Growth Chart    No data found.  Updated Vital Signs BP 111/72 (BP Location: Left Arm)   Pulse 98   Temp 98.1 F (36.7 C)   Resp 18   LMP 07/21/2023   SpO2 99%   Physical Exam Vitals and nursing note reviewed.  Constitutional:      General: She is not in acute distress.    Appearance: She is not ill-appearing.  HENT:     Nose: No rhinorrhea.     Mouth/Throat:     Mouth: Mucous membranes are moist.     Dentition: Abnormal dentition. Gingival swelling and dental caries present.     Pharynx: Oropharynx is clear. No posterior oropharyngeal erythema.     Comments: Cracked tooth in left upper mouth with erythema of the gum, mild swelling Eyes:     Conjunctiva/sclera: Conjunctivae normal.  Cardiovascular:     Rate and Rhythm: Normal rate and regular rhythm.  Pulses: Normal pulses.     Heart sounds: Normal heart sounds.     Comments: RRR Pulmonary:     Effort: Pulmonary effort is normal.     Breath sounds: Normal breath sounds.  Abdominal:     Palpations: Abdomen is soft.     Tenderness: There is no abdominal tenderness. There is no guarding.  Musculoskeletal:        General: Normal range of motion.     Cervical back: Normal range of motion.  Lymphadenopathy:     Cervical: No cervical adenopathy.  Skin:    General: Skin is warm and dry.     Comments: There is one small bruise on the left thigh. About 2x3 cm  Neurological:     General: No focal deficit present.     Mental Status: She is alert and oriented to person, place, and time.     Cranial Nerves: No cranial nerve deficit.     Sensory: Sensation is intact.     Motor: Motor function is intact. No weakness or tremor.     Gait: Gait is intact.     UC Treatments / Results  Labs (all labs ordered are listed, but only abnormal results are displayed) Labs Reviewed  POCT URINALYSIS DIP (MANUAL ENTRY) - Abnormal; Notable for the following  components:      Result Value   Color, UA orange (*)    Clarity, UA cloudy (*)    Bilirubin, UA moderate (*)    Ketones, POC UA trace (5) (*)    Spec Grav, UA >=1.030 (*)    Blood, UA moderate (*)    Protein Ur, POC =30 (*)    Urobilinogen, UA 4.0 (*)    Leukocytes, UA Trace (*)    All other components within normal limits  URINE CULTURE  SARS CORONAVIRUS 2 (TAT 6-24 HRS)  POCT URINE PREGNANCY    EKG  Radiology No results found.  Procedures Procedures   Medications Ordered in UC Medications - No data to display  Initial Impression / Assessment and Plan / UC Course  I have reviewed the triage vital signs and the nursing notes.  Pertinent labs & imaging results that were available during my care of the patient were reviewed by me and considered in my medical decision making (see chart for details).  afebrile UPT negative UA with trace leuks, moderate RBC, elevated spec grav Also protein, trace ketones, moderate bili. Suspect mild dehydration Will culture to r/o infection although no urinary symptoms   Reassurance provided to patient bruise is not related to abx. She has no signs of allergic reaction. Discussed the fatigue and feeling warm with her, wonder if she is beginning a virus. Will test for covid.  No red flags at this time Recommend increase fluids and rest Finish abx Return and ED precautions discussed. Patient agreeable to plan  Final Clinical Impressions(s) / UC Diagnoses   Final diagnoses:  Other fatigue  Contusion of left lower leg, initial encounter  Mild dehydration     Discharge Instructions      Please continue the Augmentin as prescribed, and finish the course. Take with food to avoid upset stomach.  Your urine shows signs of dehydration. Drink LOTS of water!! It's very important to stay hydrated while you are feeling sick Try to get plenty of rest You can use ibuprofen/tylenol for aches or fever  We will call you if your covid test  returns positive.   Monitor symptoms for a few days. If  worsening or severe, please return or be seen in the emergency department      ED Prescriptions   None    PDMP not reviewed this encounter.   Marlow Baars, [place] 08/15/23 1116

## 2023-08-16 LAB — URINE CULTURE: Culture: NO GROWTH

## 2023-08-16 LAB — SARS CORONAVIRUS 2 (TAT 6-24 HRS): SARS Coronavirus 2: NEGATIVE

## 2023-08-20 DIAGNOSIS — F4320 Adjustment disorder, unspecified: Secondary | ICD-10-CM | POA: Diagnosis not present

## 2023-09-10 DIAGNOSIS — F4320 Adjustment disorder, unspecified: Secondary | ICD-10-CM | POA: Diagnosis not present

## 2024-03-24 DIAGNOSIS — F4310 Post-traumatic stress disorder, unspecified: Secondary | ICD-10-CM | POA: Diagnosis not present

## 2024-03-24 DIAGNOSIS — F411 Generalized anxiety disorder: Secondary | ICD-10-CM | POA: Diagnosis not present

## 2024-04-01 DIAGNOSIS — F411 Generalized anxiety disorder: Secondary | ICD-10-CM | POA: Diagnosis not present

## 2024-04-01 DIAGNOSIS — F4310 Post-traumatic stress disorder, unspecified: Secondary | ICD-10-CM | POA: Diagnosis not present

## 2024-04-09 DIAGNOSIS — F4310 Post-traumatic stress disorder, unspecified: Secondary | ICD-10-CM | POA: Diagnosis not present

## 2024-04-09 DIAGNOSIS — F411 Generalized anxiety disorder: Secondary | ICD-10-CM | POA: Diagnosis not present

## 2024-04-15 DIAGNOSIS — F4310 Post-traumatic stress disorder, unspecified: Secondary | ICD-10-CM | POA: Diagnosis not present

## 2024-04-15 DIAGNOSIS — F411 Generalized anxiety disorder: Secondary | ICD-10-CM | POA: Diagnosis not present

## 2024-04-22 DIAGNOSIS — F411 Generalized anxiety disorder: Secondary | ICD-10-CM | POA: Diagnosis not present

## 2024-04-22 DIAGNOSIS — F4310 Post-traumatic stress disorder, unspecified: Secondary | ICD-10-CM | POA: Diagnosis not present

## 2024-04-29 DIAGNOSIS — F411 Generalized anxiety disorder: Secondary | ICD-10-CM | POA: Diagnosis not present

## 2024-04-29 DIAGNOSIS — F4310 Post-traumatic stress disorder, unspecified: Secondary | ICD-10-CM | POA: Diagnosis not present
